# Patient Record
Sex: Male | Born: 1990 | Hispanic: Yes | Marital: Single | Smoking: Former smoker
Health system: Southern US, Community
[De-identification: ages and names within clinical notes are randomized; demographics above are authoritative.]

## PROBLEM LIST (undated history)

## (undated) DIAGNOSIS — B20 Human immunodeficiency virus [HIV] disease: Secondary | ICD-10-CM

## (undated) DIAGNOSIS — R109 Unspecified abdominal pain: Secondary | ICD-10-CM

## (undated) HISTORY — DX: Unspecified abdominal pain: R10.9

---

## 2009-07-16 ENCOUNTER — Emergency Department (HOSPITAL_COMMUNITY): Admission: EM | Admit: 2009-07-16 | Discharge: 2009-07-16 | Payer: Self-pay | Admitting: Emergency Medicine

## 2009-10-04 ENCOUNTER — Emergency Department (HOSPITAL_COMMUNITY): Admission: EM | Admit: 2009-10-04 | Discharge: 2009-10-04 | Payer: Self-pay | Admitting: Emergency Medicine

## 2011-06-19 ENCOUNTER — Emergency Department (HOSPITAL_COMMUNITY)
Admission: EM | Admit: 2011-06-19 | Discharge: 2011-06-20 | Disposition: A | Discharge status: Home / Self Care | Payer: Self-pay | Attending: Emergency Medicine | Admitting: Emergency Medicine

## 2011-06-19 DIAGNOSIS — N342 Other urethritis: Secondary | ICD-10-CM | POA: Insufficient documentation

## 2011-06-19 DIAGNOSIS — F17200 Nicotine dependence, unspecified, uncomplicated: Secondary | ICD-10-CM | POA: Insufficient documentation

## 2011-06-20 ENCOUNTER — Encounter (HOSPITAL_COMMUNITY): Payer: Self-pay | Admitting: Emergency Medicine

## 2011-06-20 MED ORDER — AZITHROMYCIN 1 G PO PACK
1.0000 g | PACK | Freq: Once | ORAL | Status: AC
  Administered 2011-06-20: 1 g via ORAL
  Filled 2011-06-20: qty 1

## 2011-06-20 MED ORDER — CEFTRIAXONE SODIUM 250 MG IJ SOLR
250.0000 mg | Freq: Once | INTRAMUSCULAR | Status: AC
  Administered 2011-06-20: 250 mg via INTRAMUSCULAR
  Filled 2011-06-20: qty 250

## 2011-06-20 NOTE — ED Notes (Signed)
C/o green penile discharge that started today.  Reports pain with urination x 3 days.  Reports unprotected sex while on vacation.

## 2011-06-20 NOTE — ED Notes (Signed)
Pt. Discharged to home.

## 2011-06-20 NOTE — ED Provider Notes (Signed)
History     CSN: [id number]  Arrival date & time 06/19/11  2355   First MD Initiated Contact with Patient 06/20/11 0046      Chief Complaint  Patient presents with  . SEXUALLY TRANSMITTED DISEASE    (Consider location/radiation/quality/duration/timing/severity/associated sxs/prior treatment) HPI Comments: Patient here with green penile discharge and burning with urination that started today - states that he went on vacation several weeks ago in [place] he got drunk and had unprotected intercourse with stranger.  He states that burning started about 3 days ago and then he got the discharge today - denies fever, chills, testicular pain, redness, penile pain.    Patient is a 21 y.o. male presenting with male genitourinary complaint. The history is provided by the patient. No language interpreter was used.  Male GU Problem Primary symptoms include dysuria and penile discharge.  Primary symptoms include no genital itching, no genital lesions, no genital rash, no penile pain, no priapism and no scrotal pain. This is a new problem. The current episode started 12 to 24 hours ago. The problem occurs constantly. The problem has not changed since onset.The symptoms occur during urination. The discharge is purulent. Pertinent negatives include no anorexia, no diaphoresis, no nausea, no vomiting, no abdominal pain, no abdominal swelling, no frequency, no constipation and no diarrhea. There has been no fever. He has tried nothing for the symptoms. The treatment provided no relief. Sexual activity: sexually active and multiple partners. He inconsistently uses condoms. It is unknown if his sexual partner displays symptoms of an STD.    History reviewed. No pertinent past medical history.  History reviewed. No pertinent past surgical history.  No family history on file.  History  Substance Use Topics  . Smoking status: Current Everyday Smoker  . Smokeless tobacco: Not on file  . Alcohol Use: No       Review of Systems  Constitutional: Negative for diaphoresis.  Gastrointestinal: Negative for nausea, vomiting, abdominal pain, diarrhea, constipation and anorexia.  Genitourinary: Positive for dysuria, discharge and penile discharge. Negative for frequency, scrotal swelling, penile pain and testicular pain.  All other systems reviewed and are negative.    Allergies  Review of patient's allergies indicates no known allergies.  Home Medications  No current outpatient prescriptions on file.  BP 120/76  Pulse 82  Temp 98.8 F (37.1 C) (Oral)  Resp 16  SpO2 97%  Physical Exam  Nursing note and vitals reviewed. Constitutional: He is oriented to person, place, and time. He appears well-developed and well-nourished. No distress.  HENT:  Head: Normocephalic and atraumatic.  Right Ear: External ear normal.  Left Ear: External ear normal.  Nose: Nose normal.  Mouth/Throat: Oropharynx is clear and moist. No oropharyngeal exudate.  Eyes: Conjunctivae are normal. Pupils are equal, round, and reactive to light. No scleral icterus.  Neck: Normal range of motion. Neck supple.  Cardiovascular: Normal rate, regular rhythm and normal heart sounds.  Exam reveals no gallop and no friction rub.   No murmur heard. Pulmonary/Chest: Effort normal and breath sounds normal. No respiratory distress. He has no wheezes. He has no rales. He exhibits no tenderness.  Abdominal: Soft. Bowel sounds are normal. He exhibits no distension. There is no tenderness.  Genitourinary: Testes normal. Cremasteric reflex is present. Right testis shows no mass, no swelling and no tenderness. Left testis shows no mass, no swelling and no tenderness. Uncircumcised. No penile tenderness. Discharge found.  Musculoskeletal: Normal range of motion. He exhibits no edema and no tenderness.  Lymphadenopathy:    He has no cervical adenopathy.  Neurological: He is alert and oriented to person, place, and time. No cranial  nerve deficit.  Skin: Skin is warm and dry. No rash noted. No erythema. No pallor.  Psychiatric: He has a normal mood and affect. His behavior is normal. Judgment and thought content normal.    ED Course  Procedures (including critical care time)   Labs Reviewed  GC/CHLAMYDIA PROBE AMP, GENITAL   No results found.   Urethritis    MDM  Patient here with symptoms consistent with gonorrhea, though I will treat for both gonorrhea and chlamydia.  Culture sent and patient encouraged to get HIV testing at the Health department as well.        Izola Price [place], [place] 06/20/11 [phone]

## 2011-06-20 NOTE — ED Notes (Signed)
Received pt. From triage, pt. Alert and oriented, gait steady, pt. To BR

## 2011-06-20 NOTE — ED Provider Notes (Signed)
Medical screening examination/treatment/procedure(s) were performed by non-physician practitioner and as supervising physician I was immediately available for consultation/collaboration.  Jasmine Awe, MD 06/20/11 [phone]

## 2011-06-20 NOTE — Discharge Instructions (Signed)
Urethritis, Adult Urethritis is an inflammation (soreness) of the urethra (the tube exiting from the bladder). It is often caused by germs that may be spread through sexual contact. TREATMENT  Urethritis will usually respond to antibiotics. These are medications that kill germs. Take all the medicine given to you. You may feel better in a couple days, but TAKE ALL MEDICINE or the infection may not be completely cured and may become more difficult to treat. Response can generally be expected in 7 to 10 days. You may require additional treatment after more testing. HOME CARE INSTRUCTIONS  Not have sex until the test results are known and treatment is completed.   Know that you may be asked to notify your sex partner when your final test results are back.   Finish all medications as prescribed.   Prevent sexually transmitted infections including AIDS. Practice safe sex. Use condoms.  SEEK MEDICAL CARE IF:   Your symptoms are not improved in 2 to 3 days.   Your symptoms are getting worse.   Your develop abdominal pain.   You develop joint pain.  SEEK IMMEDIATE MEDICAL CARE IF:   You have a fever.   You develop severe pain in the belly, back or side.   You develop repeated vomiting.  TEST RESULTS Not all test results are available during your visit. If your test results are not back during the visit, make an appointment with your caregiver to find out the results. Do not assume everything is normal if you have not heard from your caregiver or the medical facility. It is important for you to follow-up on all of your test results. Document Released: 06/19/2000 Document Revised: 12/13/2010 Document Reviewed: 01/09/2009 Exit[employer] Patient Information 2012 [employer].Urethritis, Adult Urethritis is an inflammation (soreness) of the urethra (the tube exiting from the bladder). It is often caused by germs that may be spread through sexual contact. TREATMENT  Urethritis will usually respond  to antibiotics. These are medications that kill germs. Take all the medicine given to you. You may feel better in a couple days, but TAKE ALL MEDICINE or the infection may not be completely cured and may become more difficult to treat. Response can generally be expected in 7 to 10 days. You may require additional treatment after more testing. HOME CARE INSTRUCTIONS  Not have sex until the test results are known and treatment is completed.   Know that you may be asked to notify your sex partner when your final test results are back.   Finish all medications as prescribed.   Prevent sexually transmitted infections including AIDS. Practice safe sex. Use condoms.  SEEK MEDICAL CARE IF:   Your symptoms are not improved in 2 to 3 days.   Your symptoms are getting worse.   Your develop abdominal pain.   You develop joint pain.  SEEK IMMEDIATE MEDICAL CARE IF:   You have a fever.   You develop severe pain in the belly, back or side.   You develop repeated vomiting.  TEST RESULTS Not all test results are available during your visit. If your test results are not back during the visit, make an appointment with your caregiver to find out the results. Do not assume everything is normal if you have not heard from your caregiver or the medical facility. It is important for you to follow-up on all of your test results. Document Released: 06/19/2000 Document Revised: 12/13/2010 Document Reviewed: 01/09/2009 Exit[employer] Patient Information 2012 [employer]. 

## 2011-06-22 LAB — GC/CHLAMYDIA PROBE AMP, GENITAL
Chlamydia, DNA Probe: NEGATIVE
GC Probe Amp, Genital: POSITIVE — AB

## 2011-06-23 NOTE — ED Notes (Signed)
+   Chlamydia. Treated with Rocephin and Zithromax. [facility] faxed. Will need to contact patient with result.

## 2011-06-27 NOTE — ED Notes (Signed)
Attempt made to contact patient via phone.-customer unavailable.

## 2011-06-28 NOTE — ED Notes (Signed)
Attempted to contact patient. Patient was not available. Sent letter after no answer x 3.

## 2011-07-02 NOTE — ED Notes (Addendum)
No response from letter.Chart closed out and sent to medical records.

## 2012-06-14 ENCOUNTER — Encounter (HOSPITAL_COMMUNITY): Payer: Self-pay | Admitting: Emergency Medicine

## 2012-06-14 ENCOUNTER — Emergency Department (HOSPITAL_COMMUNITY)
Admission: EM | Admit: 2012-06-14 | Discharge: 2012-06-14 | Disposition: A | Discharge status: Home / Self Care | Payer: BC Managed Care – PPO | Attending: Emergency Medicine | Admitting: Emergency Medicine

## 2012-06-14 DIAGNOSIS — F17200 Nicotine dependence, unspecified, uncomplicated: Secondary | ICD-10-CM | POA: Insufficient documentation

## 2012-06-14 DIAGNOSIS — R369 Urethral discharge, unspecified: Secondary | ICD-10-CM

## 2012-06-14 LAB — RPR: RPR Ser Ql: NONREACTIVE

## 2012-06-14 MED ORDER — CEFTRIAXONE SODIUM 250 MG IJ SOLR
250.0000 mg | Freq: Once | INTRAMUSCULAR | Status: AC
  Administered 2012-06-14: 250 mg via INTRAMUSCULAR
  Filled 2012-06-14: qty 250

## 2012-06-14 MED ORDER — AZITHROMYCIN 250 MG PO TABS
1000.0000 mg | ORAL_TABLET | Freq: Once | ORAL | Status: AC
  Administered 2012-06-14: 1000 mg via ORAL
  Filled 2012-06-14: qty 4

## 2012-06-14 MED ORDER — METRONIDAZOLE 500 MG PO TABS
2000.0000 mg | ORAL_TABLET | Freq: Once | ORAL | Status: AC
  Administered 2012-06-14: 2000 mg via ORAL
  Filled 2012-06-14: qty 4

## 2012-06-14 MED ORDER — LIDOCAINE HCL (PF) 1 % IJ SOLN
INTRAMUSCULAR | Status: AC
  Administered 2012-06-14: 0.9 mL
  Filled 2012-06-14: qty 5

## 2012-06-14 NOTE — ED Provider Notes (Signed)
Medical screening examination/treatment/procedure(s) were performed by non-physician practitioner and as supervising physician I was immediately available for consultation/collaboration.   Odie Edmonds, MD 06/14/12 1517 

## 2012-06-14 NOTE — ED Notes (Signed)
Patient presents to ED today with c/o of pain with urination and discharge from penis. Pt wants to be checked for STD and HIV.

## 2012-06-14 NOTE — ED Provider Notes (Signed)
History     CSN: [id number]  Arrival date & time 06/14/12  1237   First MD Initiated Contact with Patient 06/14/12 1353      Chief Complaint  Patient presents with  . Exposure to STD    (Consider location/radiation/quality/duration/timing/severity/associated sxs/prior treatment) HPI Patient says emergency department with penile discharge.  Patient, states, that he had unprotected sex about 3 weeks ago.  Patient, states, that he does not have any nausea, vomiting, abdominal pain, testicle pain, fever, diarrhea, weakness, chest pain, or shortness of breath.  Patient, states, that when he urinates he feels, significant discomfort.  She denies taking any medications prior to arrival History reviewed. No pertinent past medical history.  History reviewed. No pertinent past surgical history.  History reviewed. No pertinent family history.  History  Substance Use Topics  . Smoking status: Current Every Day Smoker  . Smokeless tobacco: Not on file  . Alcohol Use: No      Review of Systems All other systems negative except as documented in the HPI. All pertinent positives and negatives as reviewed in the HPI. Allergies  Review of patient's allergies indicates no known allergies.  Home Medications  No current outpatient prescriptions on file.  BP 112/72  Pulse 70  Temp(Src) 98 F (36.7 C) (Oral)  Resp 17  SpO2 99%  Physical Exam  Nursing note and vitals reviewed. Constitutional: He appears well-developed and well-nourished. No distress.  HENT:  Head: Normocephalic and atraumatic.  Neck: Normal range of motion. Neck supple.  Cardiovascular: Normal rate, regular rhythm and normal heart sounds.   Pulmonary/Chest: Effort normal.  Genitourinary: Testes normal. No penile erythema or penile tenderness. No discharge found.  Skin: Skin is warm and dry. No rash noted.    ED Course  Procedures (including critical care time) Patient be given IM Rocephin, Zithromax and Flagyl.   Told to return here as needed.  Follow up with health department for HIV testing.    MDM          Carlyle Dolly, PA-C 06/14/12 1406

## 2012-06-16 LAB — GC/CHLAMYDIA PROBE AMP
CT Probe RNA: POSITIVE — AB
GC Probe RNA: NEGATIVE

## 2012-06-17 NOTE — ED Notes (Signed)
+   Chlamydia Patient treated with Rocephin And Zithromax-[facility] faxed 

## 2012-09-27 ENCOUNTER — Encounter (HOSPITAL_COMMUNITY): Payer: Self-pay | Admitting: *Deleted

## 2012-09-27 ENCOUNTER — Emergency Department (HOSPITAL_COMMUNITY)
Admission: EM | Admit: 2012-09-27 | Discharge: 2012-09-28 | Disposition: A | Discharge status: Home / Self Care | Payer: BC Managed Care – PPO | Attending: Emergency Medicine | Admitting: Emergency Medicine

## 2012-09-27 DIAGNOSIS — IMO0001 Reserved for inherently not codable concepts without codable children: Secondary | ICD-10-CM | POA: Insufficient documentation

## 2012-09-27 DIAGNOSIS — B349 Viral infection, unspecified: Secondary | ICD-10-CM

## 2012-09-27 DIAGNOSIS — R110 Nausea: Secondary | ICD-10-CM | POA: Insufficient documentation

## 2012-09-27 DIAGNOSIS — B9789 Other viral agents as the cause of diseases classified elsewhere: Secondary | ICD-10-CM | POA: Insufficient documentation

## 2012-09-27 DIAGNOSIS — F17200 Nicotine dependence, unspecified, uncomplicated: Secondary | ICD-10-CM | POA: Insufficient documentation

## 2012-09-27 DIAGNOSIS — R0982 Postnasal drip: Secondary | ICD-10-CM | POA: Insufficient documentation

## 2012-09-27 DIAGNOSIS — R109 Unspecified abdominal pain: Secondary | ICD-10-CM | POA: Insufficient documentation

## 2012-09-27 DIAGNOSIS — R059 Cough, unspecified: Secondary | ICD-10-CM | POA: Insufficient documentation

## 2012-09-27 DIAGNOSIS — R05 Cough: Secondary | ICD-10-CM | POA: Insufficient documentation

## 2012-09-27 DIAGNOSIS — R51 Headache: Secondary | ICD-10-CM | POA: Insufficient documentation

## 2012-09-27 DIAGNOSIS — J3489 Other specified disorders of nose and nasal sinuses: Secondary | ICD-10-CM | POA: Insufficient documentation

## 2012-09-27 LAB — URINALYSIS, ROUTINE W REFLEX MICROSCOPIC
Bilirubin Urine: NEGATIVE
Leukocytes, UA: NEGATIVE
Nitrite: NEGATIVE
Specific Gravity, Urine: 1.027 (ref 1.005–1.030)
pH: 7 (ref 5.0–8.0)

## 2012-09-27 LAB — COMPREHENSIVE METABOLIC PANEL
Albumin: 4.1 g/dL (ref 3.5–5.2)
Alkaline Phosphatase: 50 U/L (ref 39–117)
BUN: 12 mg/dL (ref 6–23)
Potassium: 4 mEq/L (ref 3.5–5.1)
Sodium: 135 mEq/L (ref 135–145)
Total Protein: 6.8 g/dL (ref 6.0–8.3)

## 2012-09-27 LAB — CBC WITH DIFFERENTIAL/PLATELET
Basophils Relative: 0 % (ref 0–1)
Eosinophils Absolute: 0 10*3/uL (ref 0.0–0.7)
MCH: 28.5 pg (ref 26.0–34.0)
MCHC: 34.8 g/dL (ref 30.0–36.0)
Monocytes Relative: 11 % (ref 3–12)
Neutrophils Relative %: 62 % (ref 43–77)
Platelets: 123 10*3/uL — ABNORMAL LOW (ref 150–400)
RDW: 13.5 % (ref 11.5–15.5)

## 2012-09-27 LAB — LIPASE, BLOOD: Lipase: 30 U/L (ref 11–59)

## 2012-09-27 MED ORDER — PSEUDOEPHEDRINE HCL ER 120 MG PO TB12
120.0000 mg | ORAL_TABLET | Freq: Once | ORAL | Status: AC
  Administered 2012-09-28: 120 mg via ORAL
  Filled 2012-09-27: qty 1

## 2012-09-27 MED ORDER — ACETAMINOPHEN 325 MG PO TABS
650.0000 mg | ORAL_TABLET | Freq: Once | ORAL | Status: AC
  Administered 2012-09-27: 650 mg via ORAL

## 2012-09-27 MED ORDER — PSEUDOEPHEDRINE HCL ER 120 MG PO TB12: 120.0000 mg | ORAL_TABLET | Freq: Two times a day (BID) | ORAL | Status: DC

## 2012-09-27 NOTE — ED Notes (Signed)
Cold sorethroat fever since yesterday

## 2012-09-27 NOTE — ED Provider Notes (Signed)
CSN: [id number]     Arrival date & time 09/27/12  1933 History   First MD Initiated Contact with Patient 09/27/12 2325     Chief Complaint  Patient presents with  . Sore Throat   (Consider location/radiation/quality/duration/timing/severity/associated sxs/prior Treatment) HPI Comments: Patient states, that for the past, week.  He's had rhinitis, nasal congestion, postnasal drip, occasional cough, headache, nausea, but no vomiting, and generalized myalgias.  She reports fever to 99.6. States he is taking ibuprofen occasionally, without any relief  Patient is a 22 y.o. male presenting with pharyngitis. The history is provided by the patient.  Sore Throat This is a new problem. The current episode started in the past 7 days. The problem occurs intermittently. The problem has been unchanged. Associated symptoms include abdominal pain, congestion, coughing, headaches, myalgias, nausea and a sore throat. Pertinent negatives include no fever, neck pain, vomiting or weakness. The symptoms are aggravated by drinking and swallowing. He has tried NSAIDs for the symptoms. The treatment provided mild relief.    History reviewed. No pertinent past medical history. History reviewed. No pertinent past surgical history. No family history on file. History  Substance Use Topics  . Smoking status: Current Every Day Smoker  . Smokeless tobacco: Not on file  . Alcohol Use: No    Review of Systems  Constitutional: Negative for fever.  HENT: Positive for congestion, sore throat, rhinorrhea and postnasal drip. Negative for ear pain and neck pain.   Respiratory: Positive for cough. Negative for shortness of breath.   Gastrointestinal: Positive for nausea and abdominal pain. Negative for vomiting, diarrhea and constipation.  Genitourinary: Negative for dysuria.  Musculoskeletal: Positive for myalgias.  Neurological: Positive for headaches. Negative for dizziness and weakness.  All other systems reviewed and  are negative.    Allergies  Review of patient's allergies indicates no known allergies.  Home Medications   Current Outpatient Rx  Name  Route  Sig  Dispense  Refill  . ibuprofen (ADVIL,MOTRIN) 200 MG tablet   Oral   Take 400 mg by mouth every 6 (six) hours as needed for pain.         . naproxen sodium (ANAPROX) 220 MG tablet   Oral   Take 440 mg by mouth as needed (pain).         . pseudoephedrine (SUDAFED 12 HOUR) 120 MG 12 hr tablet   Oral   Take 1 tablet (120 mg total) by mouth every 12 (twelve) hours.   30 tablet   0    BP 113/77  Pulse 105  Temp(Src) 100.9 F (38.3 C) (Oral)  Resp 20  SpO2 99% Physical Exam  Nursing note and vitals reviewed. Constitutional: He is oriented to person, place, and time. He appears well-developed and well-nourished.  HENT:  Head: Normocephalic.  Right Ear: External ear normal.  Left Ear: External ear normal.  Posterior pharynx slightly erythematous.  No tonsillar exudate  Eyes: Pupils are equal, round, and reactive to light.  Neck: Normal range of motion.  Cardiovascular: Normal rate and regular rhythm.   Pulmonary/Chest: Effort normal and breath sounds normal. No respiratory distress. He has no wheezes. He exhibits no tenderness.  Abdominal: Soft. He exhibits no distension.  Neurological: He is alert and oriented to person, place, and time.  Skin: No rash noted. No erythema.    ED Course  Procedures (including critical care time) Labs Review Labs Reviewed  CBC WITH DIFFERENTIAL - Abnormal; Notable for the following:    WBC 3.4 (*)  Platelets 123 (*)    All other components within normal limits  COMPREHENSIVE METABOLIC PANEL - Abnormal; Notable for the following:    Glucose, Bld 106 (*)    Total Bilirubin 0.2 (*)    All other components within normal limits  RAPID STREP SCREEN  CULTURE, GROUP A STREP  LIPASE, BLOOD  URINALYSIS, ROUTINE W REFLEX MICROSCOPIC   Imaging Review No results found.  MDM   1. Viral  syndrome     Patient is in no acute distress    Arman Filter, NP 09/27/12 2345

## 2012-09-28 NOTE — ED Provider Notes (Signed)
Medical screening examination/treatment/procedure(s) were performed by non-physician practitioner and as supervising physician I was immediately available for consultation/collaboration.  Amarii Bordas M Keyatta Tolles, MD 09/28/12 0524 

## 2012-09-29 LAB — CULTURE, GROUP A STREP

## 2012-10-06 ENCOUNTER — Encounter (HOSPITAL_COMMUNITY): Payer: Self-pay | Admitting: Emergency Medicine

## 2012-10-06 ENCOUNTER — Emergency Department (HOSPITAL_COMMUNITY)
Admission: EM | Admit: 2012-10-06 | Discharge: 2012-10-07 | Disposition: A | Discharge status: Home / Self Care | Payer: BC Managed Care – PPO | Attending: Emergency Medicine | Admitting: Emergency Medicine

## 2012-10-06 DIAGNOSIS — R1110 Vomiting, unspecified: Secondary | ICD-10-CM | POA: Insufficient documentation

## 2012-10-06 DIAGNOSIS — K5289 Other specified noninfective gastroenteritis and colitis: Secondary | ICD-10-CM | POA: Insufficient documentation

## 2012-10-06 DIAGNOSIS — K529 Noninfective gastroenteritis and colitis, unspecified: Secondary | ICD-10-CM

## 2012-10-06 DIAGNOSIS — R109 Unspecified abdominal pain: Secondary | ICD-10-CM | POA: Insufficient documentation

## 2012-10-06 DIAGNOSIS — F17200 Nicotine dependence, unspecified, uncomplicated: Secondary | ICD-10-CM | POA: Insufficient documentation

## 2012-10-06 DIAGNOSIS — R51 Headache: Secondary | ICD-10-CM | POA: Insufficient documentation

## 2012-10-06 LAB — CBC WITH DIFFERENTIAL/PLATELET
Basophils Relative: 1 % (ref 0–1)
Eosinophils Absolute: 0 10*3/uL (ref 0.0–0.7)
MCH: 28.2 pg (ref 26.0–34.0)
MCHC: 35.2 g/dL (ref 30.0–36.0)
Neutrophils Relative %: 57 % (ref 43–77)
Platelets: 228 10*3/uL (ref 150–400)
RBC: 5.31 MIL/uL (ref 4.22–5.81)
RDW: 13.2 % (ref 11.5–15.5)

## 2012-10-06 LAB — COMPREHENSIVE METABOLIC PANEL
ALT: 45 U/L (ref 0–53)
Albumin: 4 g/dL (ref 3.5–5.2)
Alkaline Phosphatase: 51 U/L (ref 39–117)
Calcium: 9.2 mg/dL (ref 8.4–10.5)
Potassium: 3.4 mEq/L — ABNORMAL LOW (ref 3.5–5.1)
Sodium: 139 mEq/L (ref 135–145)
Total Protein: 7.1 g/dL (ref 6.0–8.3)

## 2012-10-06 LAB — LIPASE, BLOOD: Lipase: 19 U/L (ref 11–59)

## 2012-10-06 MED ORDER — IOHEXOL 300 MG/ML  SOLN
25.0000 mL | INTRAMUSCULAR | Status: AC
  Administered 2012-10-06 (×2): 25 mL via ORAL

## 2012-10-06 MED ORDER — SODIUM CHLORIDE 0.9 % IV BOLUS (SEPSIS)
1000.0000 mL | Freq: Once | INTRAVENOUS | Status: AC
  Administered 2012-10-06: 1000 mL via INTRAVENOUS

## 2012-10-06 MED ORDER — SODIUM CHLORIDE 0.9 % IV SOLN
INTRAVENOUS | Status: DC
  Administered 2012-10-06: 22:00:00 via INTRAVENOUS

## 2012-10-06 MED ORDER — METOCLOPRAMIDE HCL 5 MG/ML IJ SOLN
5.0000 mg | Freq: Once | INTRAMUSCULAR | Status: AC
  Administered 2012-10-06: 5 mg via INTRAVENOUS
  Filled 2012-10-06: qty 2

## 2012-10-06 MED ORDER — MORPHINE SULFATE 4 MG/ML IJ SOLN
4.0000 mg | Freq: Once | INTRAMUSCULAR | Status: AC
  Administered 2012-10-06: 4 mg via INTRAVENOUS
  Filled 2012-10-06: qty 1

## 2012-10-06 MED ORDER — ONDANSETRON HCL 4 MG/2ML IJ SOLN
4.0000 mg | Freq: Once | INTRAMUSCULAR | Status: AC
  Administered 2012-10-06: 4 mg via INTRAVENOUS

## 2012-10-06 MED ORDER — ONDANSETRON HCL 4 MG/2ML IJ SOLN
INTRAMUSCULAR | Status: AC
  Administered 2012-10-06: 4 mg via INTRAVENOUS
  Filled 2012-10-06: qty 2

## 2012-10-06 NOTE — ED Notes (Signed)
Pt. reports persistent diarrhea with emesis , generalized body aches and generalized abdominal cramping for several days .

## 2012-10-06 NOTE — ED Provider Notes (Signed)
CSN: [id number]     Arrival date & time 10/06/12  1904 History   First MD Initiated Contact with Patient 10/06/12 2122     Chief Complaint  Patient presents with  . Diarrhea   (Consider location/radiation/quality/duration/timing/severity/associated sxs/prior Treatment) Patient is a 22 y.o. male presenting with diarrhea. The history is provided by the patient.  Diarrhea  patient complaining of persistent diarrhea and emesis x5 days. His emesis has been bilious and diarrhea has been watery. Notes generalized body aches and temperature at home up to 100.8. Denies any cough. No cervical or ear pain. Seen for similar symptoms a few days ago and had negative workup at that time and diagnosed with viral syndrome. No urinary symptoms reported. Symptoms have been gradually worse and nothing makes them better. No new treatments used prior to arrival  History reviewed. No pertinent past medical history. History reviewed. No pertinent past surgical history. No family history on file. History  Substance Use Topics  . Smoking status: Current Every Day Smoker  . Smokeless tobacco: Not on file  . Alcohol Use: No    Review of Systems  Gastrointestinal: Positive for diarrhea.  All other systems reviewed and are negative.    Allergies  Review of patient's allergies indicates no known allergies.  Home Medications   Current Outpatient Rx  Name  Route  Sig  Dispense  Refill  . ibuprofen (ADVIL,MOTRIN) 200 MG tablet   Oral   Take 400 mg by mouth every 6 (six) hours as needed for pain.         . naproxen sodium (ANAPROX) 220 MG tablet   Oral   Take 440 mg by mouth as needed (pain).         . pseudoephedrine (SUDAFED 12 HOUR) 120 MG 12 hr tablet   Oral   Take 1 tablet (120 mg total) by mouth every 12 (twelve) hours.   30 tablet   0    BP 114/67  Pulse 80  Temp(Src) 98.5 F (36.9 C) (Oral)  Resp 18  SpO2 98% Physical Exam  Nursing note and vitals reviewed. Constitutional: He is  oriented to person, place, and time. He appears well-developed and well-nourished.  Non-toxic appearance. No distress.  HENT:  Head: Normocephalic and atraumatic.  Eyes: Conjunctivae, EOM and lids are normal. Pupils are equal, round, and reactive to light.  Neck: Normal range of motion. Neck supple. No tracheal deviation present. No mass present.  Cardiovascular: Normal rate, regular rhythm and normal heart sounds.  Exam reveals no gallop.   No murmur heard. Pulmonary/Chest: Effort normal and breath sounds normal. No stridor. No respiratory distress. He has no decreased breath sounds. He has no wheezes. He has no rhonchi. He has no rales.  Abdominal: Soft. Normal appearance and bowel sounds are normal. He exhibits no distension. There is tenderness in the right upper quadrant and epigastric area. There is guarding. There is no rigidity, no rebound and no CVA tenderness.    Musculoskeletal: Normal range of motion. He exhibits no edema and no tenderness.  Neurological: He is alert and oriented to person, place, and time. He has normal strength. No cranial nerve deficit or sensory deficit. GCS eye subscore is 4. GCS verbal subscore is 5. GCS motor subscore is 6.  Skin: Skin is warm and dry. No abrasion and no rash noted.  Psychiatric: He has a normal mood and affect. His speech is normal and behavior is normal.    ED Course  Procedures (including critical care time)  Labs Review Labs Reviewed  CBC WITH DIFFERENTIAL - Abnormal; Notable for the following:    Monocytes Relative 13 (*)    All other components within normal limits  COMPREHENSIVE METABOLIC PANEL - Abnormal; Notable for the following:    Potassium 3.4 (*)    AST 50 (*)    All other components within normal limits  URINALYSIS, ROUTINE W REFLEX MICROSCOPIC  LIPASE, BLOOD   Imaging Review No results found.  MDM  No diagnosis found. Pt given pain meds and iv fluids--abd ct pending, dr. Effie Shy to f/u    Toy Baker,  MD 10/06/12 2312

## 2012-10-06 NOTE — ED Notes (Signed)
During nursing assessment, pt started to vomit yellow emesis.  Per protocol, 4 mg of Zofran IV overridden from pyxis.

## 2012-10-07 ENCOUNTER — Encounter (HOSPITAL_COMMUNITY): Payer: Self-pay | Admitting: Radiology

## 2012-10-07 ENCOUNTER — Emergency Department (HOSPITAL_COMMUNITY): Payer: BC Managed Care – PPO

## 2012-10-07 LAB — URINALYSIS, ROUTINE W REFLEX MICROSCOPIC
Bilirubin Urine: NEGATIVE
Glucose, UA: NEGATIVE mg/dL
Ketones, ur: NEGATIVE mg/dL
Leukocytes, UA: NEGATIVE
Specific Gravity, Urine: 1.01 (ref 1.005–1.030)
pH: 6.5 (ref 5.0–8.0)

## 2012-10-07 MED ORDER — IOHEXOL 300 MG/ML  SOLN
100.0000 mL | Freq: Once | INTRAMUSCULAR | Status: AC | PRN
  Administered 2012-10-07: 100 mL via INTRAVENOUS

## 2012-10-07 MED ORDER — OXYCODONE-ACETAMINOPHEN 5-325 MG PO TABS
1.0000 | ORAL_TABLET | Freq: Once | ORAL | Status: AC
  Administered 2012-10-07: 1 via ORAL
  Filled 2012-10-07: qty 1

## 2012-10-07 MED ORDER — OXYCODONE-ACETAMINOPHEN 5-325 MG PO TABS: 1.0000 | ORAL_TABLET | ORAL | Status: DC | PRN

## 2012-10-07 MED ORDER — ONDANSETRON HCL 8 MG PO TABS: 8.0000 mg | ORAL_TABLET | Freq: Three times a day (TID) | ORAL | Status: DC | PRN

## 2012-10-07 MED ORDER — ONDANSETRON 4 MG PO TBDP
8.0000 mg | ORAL_TABLET | Freq: Once | ORAL | Status: AC
  Administered 2012-10-07: 8 mg via ORAL
  Filled 2012-10-07: qty 2

## 2012-10-07 MED ORDER — METOCLOPRAMIDE HCL 5 MG/ML IJ SOLN
5.0000 mg | Freq: Once | INTRAMUSCULAR | Status: AC
  Administered 2012-10-07: 5 mg via INTRAVENOUS

## 2012-10-07 NOTE — ED Provider Notes (Signed)
Reevaluation (1610): He was initially seen by Dr. Freida Busman for vomiting, and diarrhea. CT scan was ordered. Cannulated for persistent vomiting, requiring multiple treatments. CT scan has been done, and does not show acute abnormality. She states his abdominal pain has resolved. He complains of a persistent headache  PE; repeat vital signs are normal. Abdomen nontender to palpation. Heart regular rate and rhythm. No murmur. Neck is supple. Neurologic nonfocal  Ct Abdomen Pelvis W Contrast  10/07/2012   CLINICAL DATA:  Persistent diarrhea. Body aches.  EXAM: CT ABDOMEN AND PELVIS WITH CONTRAST  TECHNIQUE: Multidetector CT imaging of the abdomen and pelvis was performed using the standard protocol following bolus administration of intravenous contrast.  CONTRAST:  OMNIPAQUE IOHEXOL 300 MG/ML  SOLN  COMPARISON:  None.  FINDINGS: Lung bases are clear. No effusions. Heart is normal size.  Liver, gallbladder, spleen, pancreas, adrenals and kidneys are normal. Appendix is visualized and is normal. Bowel grossly unremarkable. No free fluid, free air, or adenopathy. Urinary bladder is unremarkable.  No acute bony abnormality.  IMPRESSION: Negative study.   Electronically Signed   By: Charlett Nose M.D.   On: 10/07/2012 02:36   Results for orders placed during the hospital encounter of 10/06/12  CBC WITH DIFFERENTIAL      Result Value Range   WBC 4.7  4.0 - 10.5 K/uL   RBC 5.31  4.22 - 5.81 MIL/uL   Hemoglobin 15.0  13.0 - 17.0 g/dL   HCT 96.0  45.4 - 09.8 %   MCV 80.2  78.0 - 100.0 fL   MCH 28.2  26.0 - 34.0 pg   MCHC 35.2  30.0 - 36.0 g/dL   RDW 11.9  14.7 - 82.9 %   Platelets 228  150 - 400 K/uL   Neutrophils Relative % 57  43 - 77 %   Neutro Abs 2.7  1.7 - 7.7 K/uL   Lymphocytes Relative 29  12 - 46 %   Lymphs Abs 1.4  0.7 - 4.0 K/uL   Monocytes Relative 13 (*) 3 - 12 %   Monocytes Absolute 0.6  0.1 - 1.0 K/uL   Eosinophils Relative 0  0 - 5 %   Eosinophils Absolute 0.0  0.0 - 0.7 K/uL   Basophils Relative 1  0 - 1 %   Basophils Absolute 0.1  0.0 - 0.1 K/uL  COMPREHENSIVE METABOLIC PANEL      Result Value Range   Sodium 139  135 - 145 mEq/L   Potassium 3.4 (*) 3.5 - 5.1 mEq/L   Chloride 102  96 - 112 mEq/L   CO2 26  19 - 32 mEq/L   Glucose, Bld 93  70 - 99 mg/dL   BUN 11  6 - 23 mg/dL   Creatinine, Ser 5.62  0.50 - 1.35 mg/dL   Calcium 9.2  8.4 - 13.0 mg/dL   Total Protein 7.1  6.0 - 8.3 g/dL   Albumin 4.0  3.5 - 5.2 g/dL   AST 50 (*) 0 - 37 U/L   ALT 45  0 - 53 U/L   Alkaline Phosphatase 51  39 - 117 U/L   Total Bilirubin 0.4  0.3 - 1.2 mg/dL   GFR calc non Af Amer >90  >90 mL/min   GFR calc Af Amer >90  >90 mL/min  LIPASE, BLOOD      Result Value Range   Lipase 19  11 - 59 U/L      We'll give additional medication for pain  and nausea and arrange discharge  Assessment: Gastroenteritis with nonspecific headache. He is stable for discharge. Doubt colitis, meningitis, sinusitis, or metabolic instability.  Nursing Notes Reviewed/ Care Coordinated, and agree without changes. Applicable Imaging Reviewed.  Interpretation of Laboratory Data incorporated into ED treatment   Plan: Home Medications- Percocet, Zofran; Home Treatments and Observation- rest, gradually advance diet; return here if the recommended treatment, does not improve the symptoms; Recommended follow up- PCP prn      Flint Melter, MD 10/07/12 [phone]

## 2012-10-07 NOTE — ED Notes (Signed)
This RN called into room by pt.  Pt noted to have thrown up CT contrast in emesis bag.  Emesis yellow in nature.  EDP made aware.

## 2012-10-07 NOTE — ED Notes (Signed)
CT notified of delay in pt drinking contrast.  Pt tolerating second cup of contrast.

## 2013-01-22 ENCOUNTER — Telehealth (HOSPITAL_COMMUNITY): Payer: Self-pay

## 2013-01-22 NOTE — ED Notes (Signed)
[facility] to [facility] this is same individual who has tested (+) [place] in [place].  Provided last 4 of pt's SS# for verification

## 2013-05-26 ENCOUNTER — Encounter (HOSPITAL_COMMUNITY): Payer: Self-pay | Admitting: Emergency Medicine

## 2013-05-26 ENCOUNTER — Emergency Department (HOSPITAL_COMMUNITY)
Admission: EM | Admit: 2013-05-26 | Discharge: 2013-05-26 | Discharge status: Against Medical Advice | Payer: BC Managed Care – PPO | Attending: Emergency Medicine | Admitting: Emergency Medicine

## 2013-05-26 DIAGNOSIS — F17200 Nicotine dependence, unspecified, uncomplicated: Secondary | ICD-10-CM | POA: Insufficient documentation

## 2013-05-26 DIAGNOSIS — R51 Headache: Secondary | ICD-10-CM | POA: Insufficient documentation

## 2013-05-26 LAB — CBC WITH DIFFERENTIAL/PLATELET
BASOS ABS: 0.1 10*3/uL (ref 0.0–0.1)
Basophils Relative: 1 % (ref 0–1)
Eosinophils Absolute: 0.4 10*3/uL (ref 0.0–0.7)
Eosinophils Relative: 5 % (ref 0–5)
HCT: 47.2 % (ref 39.0–52.0)
HEMOGLOBIN: 15.6 g/dL (ref 13.0–17.0)
LYMPHS PCT: 41 % (ref 12–46)
Lymphs Abs: 2.8 10*3/uL (ref 0.7–4.0)
MCH: 28.4 pg (ref 26.0–34.0)
MCHC: 33.1 g/dL (ref 30.0–36.0)
MCV: 85.8 fL (ref 78.0–100.0)
MONO ABS: 0.7 10*3/uL (ref 0.1–1.0)
MONOS PCT: 10 % (ref 3–12)
NEUTROS PCT: 43 % (ref 43–77)
Neutro Abs: 2.9 10*3/uL (ref 1.7–7.7)
Platelets: 198 10*3/uL (ref 150–400)
RBC: 5.5 MIL/uL (ref 4.22–5.81)
RDW: 14.8 % (ref 11.5–15.5)
WBC: 6.8 10*3/uL (ref 4.0–10.5)

## 2013-05-26 LAB — URINALYSIS, ROUTINE W REFLEX MICROSCOPIC
Bilirubin Urine: NEGATIVE
Glucose, UA: NEGATIVE mg/dL
Hgb urine dipstick: NEGATIVE
Ketones, ur: NEGATIVE mg/dL
LEUKOCYTES UA: NEGATIVE
Nitrite: NEGATIVE
PH: 6 (ref 5.0–8.0)
Protein, ur: NEGATIVE mg/dL
Specific Gravity, Urine: 1.026 (ref 1.005–1.030)
Urobilinogen, UA: 1 mg/dL (ref 0.0–1.0)

## 2013-05-26 LAB — COMPREHENSIVE METABOLIC PANEL
ALT: 13 U/L (ref 0–53)
AST: 23 U/L (ref 0–37)
Albumin: 4.6 g/dL (ref 3.5–5.2)
Alkaline Phosphatase: 47 U/L (ref 39–117)
BUN: 13 mg/dL (ref 6–23)
CHLORIDE: 103 meq/L (ref 96–112)
CO2: 27 meq/L (ref 19–32)
CREATININE: 0.97 mg/dL (ref 0.50–1.35)
Calcium: 9.6 mg/dL (ref 8.4–10.5)
GFR calc Af Amer: >90 mL/min (ref >90)
Glucose, Bld: 97 mg/dL (ref 70–99)
Potassium: 4.1 mEq/L (ref 3.7–5.3)
Sodium: 142 mEq/L (ref 137–147)
Total Bilirubin: 0.3 mg/dL (ref 0.3–1.2)
Total Protein: 7.7 g/dL (ref 6.0–8.3)

## 2013-05-26 NOTE — ED Notes (Addendum)
Pt is concerned he has an STD, he reports pain with urination, slight abdominal pain, itching to buttock and penis and pain with urination. Just recently came here from [place] 2 weeks ago. Pt also concerned because he ahs had diarrhea for 2 and is going 2-3 times a d weeks as well.  Associated with feeling tired and weak and nausea.

## 2014-01-04 ENCOUNTER — Emergency Department (HOSPITAL_COMMUNITY)
Admission: EM | Admit: 2014-01-04 | Discharge: 2014-01-05 | Disposition: A | Discharge status: Home / Self Care | Payer: BC Managed Care – PPO | Attending: Emergency Medicine | Admitting: Emergency Medicine

## 2014-01-04 ENCOUNTER — Encounter (HOSPITAL_COMMUNITY): Payer: Self-pay | Admitting: Emergency Medicine

## 2014-01-04 DIAGNOSIS — R369 Urethral discharge, unspecified: Secondary | ICD-10-CM

## 2014-01-04 DIAGNOSIS — Z720 Tobacco use: Secondary | ICD-10-CM | POA: Insufficient documentation

## 2014-01-04 DIAGNOSIS — Z21 Asymptomatic human immunodeficiency virus [HIV] infection status: Secondary | ICD-10-CM | POA: Insufficient documentation

## 2014-01-04 DIAGNOSIS — K1370 Unspecified lesions of oral mucosa: Secondary | ICD-10-CM | POA: Insufficient documentation

## 2014-01-04 HISTORY — DX: Human immunodeficiency virus (HIV) disease: B20

## 2014-01-04 LAB — URINE MICROSCOPIC-ADD ON

## 2014-01-04 LAB — URINALYSIS, ROUTINE W REFLEX MICROSCOPIC
Bilirubin Urine: NEGATIVE
GLUCOSE, UA: NEGATIVE mg/dL
KETONES UR: NEGATIVE mg/dL
Nitrite: NEGATIVE
Protein, ur: NEGATIVE mg/dL
Specific Gravity, Urine: 1.025 (ref 1.005–1.030)
Urobilinogen, UA: 0.2 mg/dL (ref 0.0–1.0)
pH: 5.5 (ref 5.0–8.0)

## 2014-01-04 MED ORDER — CEFTRIAXONE SODIUM 250 MG IJ SOLR
250.0000 mg | Freq: Once | INTRAMUSCULAR | Status: AC
  Administered 2014-01-04: 250 mg via INTRAMUSCULAR
  Filled 2014-01-04: qty 250

## 2014-01-04 MED ORDER — AZITHROMYCIN 250 MG PO TABS
1000.0000 mg | ORAL_TABLET | Freq: Once | ORAL | Status: AC
  Administered 2014-01-04: 1000 mg via ORAL
  Filled 2014-01-04: qty 4

## 2014-01-04 MED ORDER — STERILE WATER FOR INJECTION IJ SOLN
INTRAMUSCULAR | Status: AC
  Administered 2014-01-04: 10 mL
  Filled 2014-01-04: qty 10

## 2014-01-04 NOTE — ED Provider Notes (Signed)
CSN: [id number]     Arrival date & time 01/04/14  2221 History  This chart was scribed for non-physician practitioner, Trixie DredgeEmily Emili Mcloughlin, PA-C, working with Doug SouSam Jacubowitz, MD, by Bronson CurbJacqueline Melvin, ED Scribe. This patient was seen in room TR06C/TR06C and the patient's care was started at 11:33 PM.    Chief Complaint  Patient presents with  . Penile Discharge    The history is provided by the patient. No language interpreter was used.     HPI Comments: Carl Barber is a 23 y.o. male with HIV disease, currently untreated, who presents to the Emergency Department complaining of green penile discharge for the past 3 days. Patient also notes a little blood in his boxers. Patient reports having unprotected sex and oral sex with multiple partners. There is a lesion to the left corner of the mouth that he describes as painful and burning, in addition to penile pain when urinating. Patient has used no treatments. He denies fever, abdominal pain, nausea, vomiting, or diarrhea.  Pt states [place] previously [place] free medications but cannot afford the HIV medications on his own and has been told he doesn't qualify for assistance.  States his sexual partners do know that he is HIV positive.   Past Medical History  Diagnosis Date  . HIV disease    History reviewed. No pertinent past surgical history. No family history on file. History  Substance Use Topics  . Smoking status: Current Every Day Smoker  . Smokeless tobacco: Not on file  . Alcohol Use: No    Review of Systems  Constitutional: Negative for fever and chills.  Gastrointestinal: Negative for nausea, vomiting, abdominal pain and diarrhea.  Genitourinary: Positive for discharge. Negative for scrotal swelling and testicular pain.  Musculoskeletal: Negative for back pain.  Skin: Negative for color change, rash and wound.  Allergic/Immunologic: Positive for immunocompromised state (HIV positive, unknown CD4 count).       Allergies  Review of patient's allergies indicates no known allergies.  Home Medications   Prior to Admission medications   Not on File   Triage Vitals: BP 120/71 mmHg  Pulse 79  Temp(Src) 98.1 F (36.7 C) (Oral)  Resp 18  Ht 5\' 4"  (1.626 m)  Wt 116 lb (52.617 kg)  BMI 19.90 kg/m2  SpO2 99%  Physical Exam  Constitutional: He appears well-developed and well-nourished. No distress.  HENT:  Head: Normocephalic and atraumatic.  Neck: Neck supple.  Pulmonary/Chest: Effort normal.  Abdominal: Soft. He exhibits no distension and no mass. There is no tenderness. There is no rebound and no guarding.  Genitourinary: Testes normal. Right testis shows no mass, no swelling and no tenderness. Right testis is descended. Left testis shows no mass, no swelling and no tenderness. Left testis is descended. Uncircumcised. No phimosis, paraphimosis, penile erythema or penile tenderness. Discharge found.  No testicular pain, mass, or erythema.  Lymphadenopathy:       Right: Inguinal adenopathy present.       Left: Inguinal adenopathy present.  Neurological: He is alert.  Skin: He is not diaphoretic.  Nursing note and vitals reviewed.   ED Course  Procedures (including critical care time)  DIAGNOSTIC STUDIES: Oxygen Saturation is 99% on room air, normal by my interpretation.    COORDINATION OF CARE: At 2340 Discussed treatment plan with patient. Patient agrees.   Labs Review Labs Reviewed  URINALYSIS, ROUTINE W REFLEX MICROSCOPIC - Abnormal; Notable for the following:    Hgb urine dipstick TRACE (*)  Leukocytes, UA MODERATE (*)    All other components within normal limits  GC/CHLAMYDIA PROBE AMP  HERPES SIMPLEX VIRUS CULTURE  URINE CULTURE  URINE MICROSCOPIC-ADD ON  HIV ANTIBODY (ROUTINE TESTING)  RPR    Imaging Review No results found.   EKG Interpretation None      MDM   Final diagnoses:  Abnormal penile discharge    Afebrile, nontoxic patient with  untreated HIV p/w dysuria and penile discharge, also with lip lesion.  Treated empirically with Azithromycin and Rocephin.  Pt notes his last CD4 count was "really high."  Has been off medications for several months.  I have given the patient multiple resources and am sending a message to our case manager to help connect him to the system.   D/C home with resources for follow up.  Discussed result, findings, treatment, and follow up  with patient.  Pt given return precautions.  Pt verbalizes understanding and agrees with plan.       I personally performed the services described in this documentation, which was scribed in my presence. The recorded information has been reviewed and is accurate.    Trixie Dredgemily Madhuri Vacca, PA-C 01/05/14 [id number]  Doug SouSam Jacubowitz, MD 01/05/14 [phone]

## 2014-01-04 NOTE — ED Notes (Signed)
Pt. reports penile discharge , dysuria and sores at perioral area onset several days ago . Denies fever or chills.

## 2014-01-04 NOTE — ED Notes (Signed)
Pt from home for eval of "blisters" noted to head of penis in addition to green penile discharge x3 days. Pt denies any n/v/d or fevers at home. Admits to being sexually active with multiple partners and uses no protection. NAD noted. No abdominal pain noted by pt.

## 2014-01-05 LAB — HIV 1/2 CONFIRMATION
HIV 1 ANTIBODY: POSITIVE — AB
HIV 2 AB: NEGATIVE

## 2014-01-05 LAB — RPR

## 2014-01-05 LAB — HIV ANTIBODY (ROUTINE TESTING W REFLEX): HIV 1&2 Ab, 4th Generation: REACTIVE — AB

## 2014-01-05 NOTE — Discharge Instructions (Signed)
Read the information below.  You may return to the Emergency Department at any time for worsening condition or any new symptoms that concern you.  You were treated for chlamydia and gonorrhea.  All of your test results are still pending.  All of your sexual partners must be tested and treated.    It is very important that you use the resources provided to get treatment for your HIV disease.    Emergency Department Resource Guide 1) Find a Doctor and Pay Out of Pocket Although you won't have to find out who is covered by your insurance plan, it is a good idea to ask around and get recommendations. You will then need to call the office and see if the doctor you have chosen will accept you as a new patient and what types of options they offer for patients who are self-pay. Some doctors offer discounts or will set up payment plans for their patients who do not have insurance, but you will need to ask so you aren't surprised when you get to your appointment.  2) Contact Your Local Health Department Not all health departments have doctors that can see patients for sick visits, but many do, so it is worth a call to see if yours does. If you don't know where your local health department is, you can check in your phone book. The CDC also has a tool to help you locate your state's health department, and many state websites also have listings of all of their local health departments.  3) Find a Walk-in Clinic If your illness is not likely to be very severe or complicated, you may want to try a walk in clinic. These are popping up all over the country in pharmacies, drugstores, and shopping centers. They're usually staffed by nurse practitioners or physician assistants that have been trained to treat common illnesses and complaints. They're usually fairly quick and inexpensive. However, if you have serious medical issues or chronic medical problems, these are probably not your best option.  No Primary Care  Doctor: - Call Health Connect at  [phone] - they can help you locate a primary care doctor that  accepts your insurance, provides certain services, etc. - Physician Referral Service- [phone]  Chronic Pain Problems: Organization         Address  Phone   Notes  [facility]  [phone] Patients need to be referred by their primary care doctor.   Medication Assistance: Organization         Address  Phone   Notes  [facility] Medication [facility] [address] [place], [place] [address] [phone]) [phone] --Must be a resident of [place] [place] -- Must have NO insurance coverage whatsoever (no Medicaid/ Medicare, etc.) -- The pt. MUST have a primary care doctor that directs their care regularly and follows them in the community   MedAssist  [phone]   [employer]  [phone]    Agencies that provide inexpensive medical care: Organization         Address  Phone   Notes  [facility]  [phone]) [phone]   [facility]    [phone]   [facility] [address] [place], [place] [address] [phone]) [phone]   [facility] [address], [place] [phone]   [facility]anned Parenthood    [phone]   [facility]    [phone]) [phone]  [facility]  [address], [place] Phone:  [phone], Fax:  [phone] Hours of Operation:  9 am - 6 pm, M-F.  Also accepts Medicaid/Medicare and self-pay.  [facility]  [address], [place] Phone: [phone], Fax: [phone]. Hours of Operation:  8:30 am - 5:30 pm, M-F.  Also accepts Medicaid and self-pay.  [facility] [place] [address], [place] Phone: [phone]) [phone]   [facility] [address], [place], [place] [phone], Ext. [phone] Mondays & Thursdays: 7-9 AM.  First 15 patients are seen on a first  come, first serve basis.    Medicaid-accepting [facility] Providers:  Organization         Address  Phone   Notes  [facility] [address], [place] [phone] Also accepts self-pay patients.  [facility] [address], [place]  [phone]) [phone]   [facility] [address], [place] [phone]) [phone]   [facility] [address] [place] [address], [place] [phone]) [phone]   Renaye RakersVeita Bland [address], [place]   [phone]) [phone] Only accepts [place] Access [place] patients after they have their name applied to their card.   Self-Pay (no insurance) in [facility] [place]:  Organization         Address  Phone   Notes  Sickle Cell Patients, [facility] [address], [place] [phone]) [phone]   [facility] [address], [place] [phone]   [facility] [place]  [address], [place] [phone]) [phone]   [facility]/Dr. Osei-Bonsu  [address] [place] [address], [place] or [id number], [place] [phone]) [phone] Phone number for both [place] and [place] locations is the same.  [facility] [address], [place] [phone]) [phone]   [facility] [place] [address] [place] [address], [place] or [address] [phone]) [phone]   [facility] [address], [place] [phone]) [phone], phone; [phone]) [phone], fax Sees patients 1st and 3rd Saturday of every month.  Must not qualify for public or private insurance (i.e. Medicaid, Medicare, [place] Health Choice, Veterans' Benefits)  Household income should be no more than 200% of the poverty level The clinic cannot treat you if you are pregnant or think you are pregnant  Sexually transmitted diseases are not treated at the clinic.    Dental Care: Organization          Address  Phone  Notes  [facility] Department of [facility] [address], [place] [phone]) [phone] Accepts children up to age 24 who are enrolled in [place]icaid or [place] Health Choice; pregnant women with a Medicaid card; and children who have applied for Medicaid or [place] Health Choice, but were declined, whose parents can pay a reduced fee at time of service.  [facility] of [facility] [place]  [address], [place] [phone] Accepts children up to age 24 who are enrolled in [place]icaid or [place] Health Choice; pregnant women with a Medicaid card; and children who have applied for Medicaid or [place] Health Choice, but were declined, whose parents can pay a reduced fee at time of service.  [facility]  [phone]  [address], [place] [phone]) [phone] Patients are seen by appointment only. Walk-ins are not accepted. [facility] will see patients 24 years of age and older. Monday - Tuesday (8am-5pm) Most Wednesdays (8:30-5pm) $30 per visit, cash only  [facility]  [address], [facility] [phone] Patients are seen by appointment only. Walk-ins are not accepted. [facility] will see patients 24 years of age and older. One Wednesday Evening (Monthly: Volunteer Based).  $30 per visit, cash only  [facility]  [phone] for adults; Children under age 154, call Graduate Pediatric Dentistry at [phone]. Children aged 464-14, please call [phone] to request a pediatric application.  Dental services are provided in all areas of dental care including fillings, crowns and bridges, complete and partial dentures, implants, gum treatment, root canals, and extractions. Preventive care is also provided. Treatment is provided to both adults and children. Patients are selected via a lottery and there is often a waiting list.   [facility] [address], [place]  [phone] [email or web address]   [facility] [address], [place], [place] [phone], Ext. [phone] Second and Fourth Thursday of each month, opens at 6:30 AM; Clinic ends at 9 AM.  Patients are seen on a first-come first-served basis, and a limited number are seen during each clinic.   [facility]  [address], [place], [place] [phone]) [phone]   Eligibility Requirements You must have lived in [place], [place], or [place] counties for at least the last three months.   You cannot be eligible for state or federal sponsored National Cityhealthcare insurance, including CIGNAVeterans Administration, [place]icaid, or [employer] EntertainmentMedicare.   You generally cannot be eligible for healthcare insurance through your employer.    How to apply: Eligibility screenings are held every Tuesday and Wednesday afternoon from 1:00 pm until 4:00 pm. You do not need an appointment for the interview!  [facility] [address], [place], [place] [phone]   [facility]  [phone]   [facility]  [phone]   [facility]  [phone]    Behavioral Health Resources in the Community: Intensive Outpatient Programs Organization         Address  Phone  Notes  [facility] [address], [place], [place] [phone]   [facility] [address], [place], [place] [phone]   [facility] [address], [place], [place]  [phone]   [facility] [address],  [place], [place] [phone] or [phone]   Substance Abuse Resources Organization         Address  Phone  Notes  [facility]  [phone]   [facility]  [phone]   [facility]  [phone]   [facility]  [phone]   Residential & Outpatient Substance Abuse Program  [phone]   Psychological  Services Organization         Address  Phone  Notes  [facility]  [phone]- [phone]   [facility]  [phone]- [phone]   [facility] [address], [place] [phone] or [phone]    Mobile Crisis Teams Organization         Address  Phone  Notes  [facility], Mobile Crisis Care Unit  [phone]   [facility]  [address] [place], [place] [phone]  [facility] [address] [place] [phone]    Self-Help/Support Groups Organization         Address  Phone             Notes  [facility] [place] - variety of support groups  336- I7437963 Call for more information  Narcotics Anonymous (NA), [facility] [address], [employer] [place]  2 meetings at this location   Statistician         Address  Phone  Notes  [facility] [address],    [place]  [phone]    [facility]  [address], [place] [address], [place], [place] [phone]   [facility] [address] [place], [place] [phone] Admissions: 8am-3pm M-F  [facility] [address]    [place], [place] [phone]   [facility] [address] [place], [place], [place] [phone]   [facility] [address],  [place], [place] [phone]   [facility] - Intensive Outpatient [address], [place], [place] [phone]   [facility] ([facility] [address]  [place], [place] [phone] or [phone]   [facility] ([facility]) [address], [place], [place] [phone] Accepts Medicaid  [facility] [place] [address]  [place] [phone] Substance Abuse/Addiction Treatment   [facility] Organization         Address  Phone  Notes  [facility]  [phone]   Angie Fava, PhD [address] [place], [place]   [phone] or [phone]   [facility]   [address] [place], [place] [phone]   [facility] [address] [place], [place] [phone] Insurance/Medicaid/sponsorship through [facility] and Families [address]                                    [place], [place] [phone] Therapy/tele-psych/case  [facility] [address], [place] [phone]    Dr. Lolly Mustache  [phone]   [facility]  [employer] [facility] Dept. 1) [address] [place] 2) [address], [place] 3)  [address], [place] [phone]  [phone]   [facility] Child Abuse Hotline 470-778-8164 or [phone] (After Hours)

## 2014-01-06 ENCOUNTER — Telehealth (HOSPITAL_BASED_OUTPATIENT_CLINIC_OR_DEPARTMENT_OTHER): Payer: Self-pay | Admitting: *Deleted

## 2014-01-06 ENCOUNTER — Telehealth: Payer: Self-pay | Admitting: Licensed Clinical Social Worker

## 2014-01-06 LAB — GC/CHLAMYDIA PROBE AMP
CT Probe RNA: NEGATIVE
GC Probe RNA: POSITIVE — AB

## 2014-01-06 LAB — HERPES SIMPLEX VIRUS CULTURE: Culture: NOT DETECTED

## 2014-01-06 LAB — URINE CULTURE
COLONY COUNT: NO GROWTH
Culture: NO GROWTH

## 2014-01-06 NOTE — Telephone Encounter (Signed)
RN from ED called to inform us of a new HIV+ patient that was not notified, and was checking to see if our ID providers received notification. Patient was tested at the ED on 01/04/14

## 2014-01-10 ENCOUNTER — Telehealth: Payer: Self-pay | Admitting: Infectious Diseases

## 2014-01-10 NOTE — Telephone Encounter (Signed)
Pt is new HIV+.  Phone does not work, please have Paramedic track pt.

## 2014-01-10 NOTE — Telephone Encounter (Signed)
Dr. Ninetta Lights has been trying to reach pt but his phone does not work he needs a Paramedic

## 2014-01-11 NOTE — Telephone Encounter (Signed)
Referred to DIS so they can find him and notify him.

## 2014-01-11 NOTE — Telephone Encounter (Signed)
Referred to DIS

## 2014-01-11 NOTE — Telephone Encounter (Signed)
excellent

## 2015-01-16 ENCOUNTER — Emergency Department (HOSPITAL_COMMUNITY): Payer: Self-pay

## 2015-01-16 ENCOUNTER — Encounter (HOSPITAL_COMMUNITY): Payer: Self-pay

## 2015-01-16 ENCOUNTER — Emergency Department (HOSPITAL_COMMUNITY)
Admission: EM | Admit: 2015-01-16 | Discharge: 2015-01-16 | Disposition: A | Discharge status: Home / Self Care | Payer: Self-pay | Attending: Emergency Medicine | Admitting: Emergency Medicine

## 2015-01-16 DIAGNOSIS — F17200 Nicotine dependence, unspecified, uncomplicated: Secondary | ICD-10-CM | POA: Insufficient documentation

## 2015-01-16 DIAGNOSIS — B20 Human immunodeficiency virus [HIV] disease: Secondary | ICD-10-CM | POA: Insufficient documentation

## 2015-01-16 DIAGNOSIS — F1110 Opioid abuse, uncomplicated: Secondary | ICD-10-CM | POA: Insufficient documentation

## 2015-01-16 DIAGNOSIS — R1013 Epigastric pain: Secondary | ICD-10-CM | POA: Insufficient documentation

## 2015-01-16 DIAGNOSIS — R112 Nausea with vomiting, unspecified: Secondary | ICD-10-CM | POA: Insufficient documentation

## 2015-01-16 DIAGNOSIS — F1210 Cannabis abuse, uncomplicated: Secondary | ICD-10-CM | POA: Insufficient documentation

## 2015-01-16 LAB — COMPREHENSIVE METABOLIC PANEL
ALK PHOS: 38 U/L (ref 38–126)
ALT: 15 U/L — AB (ref 17–63)
AST: 26 U/L (ref 15–41)
Albumin: 4.4 g/dL (ref 3.5–5.0)
Anion gap: 10 (ref 5–15)
BUN: 9 mg/dL (ref 6–20)
CALCIUM: 9.2 mg/dL (ref 8.9–10.3)
CO2: 26 mmol/L (ref 22–32)
CREATININE: 0.74 mg/dL (ref 0.61–1.24)
Chloride: 104 mmol/L (ref 101–111)
Glucose, Bld: 93 mg/dL (ref 65–99)
Potassium: 4.2 mmol/L (ref 3.5–5.1)
SODIUM: 140 mmol/L (ref 135–145)
Total Bilirubin: 0.9 mg/dL (ref 0.3–1.2)
Total Protein: 7.3 g/dL (ref 6.5–8.1)

## 2015-01-16 LAB — URINALYSIS, ROUTINE W REFLEX MICROSCOPIC
BILIRUBIN URINE: NEGATIVE
Glucose, UA: NEGATIVE mg/dL
HGB URINE DIPSTICK: NEGATIVE
KETONES UR: NEGATIVE mg/dL
Leukocytes, UA: NEGATIVE
Nitrite: NEGATIVE
PROTEIN: NEGATIVE mg/dL
Specific Gravity, Urine: >1.046 — ABNORMAL HIGH (ref 1.005–1.030)
pH: 8.5 — ABNORMAL HIGH (ref 5.0–8.0)

## 2015-01-16 LAB — CBC
HCT: 45 % (ref 39.0–52.0)
Hemoglobin: 14.9 g/dL (ref 13.0–17.0)
MCH: 28 pg (ref 26.0–34.0)
MCHC: 33.1 g/dL (ref 30.0–36.0)
MCV: 84.4 fL (ref 78.0–100.0)
PLATELETS: 179 10*3/uL (ref 150–400)
RBC: 5.33 MIL/uL (ref 4.22–5.81)
RDW: 14.3 % (ref 11.5–15.5)
WBC: 5.6 10*3/uL (ref 4.0–10.5)

## 2015-01-16 LAB — RAPID URINE DRUG SCREEN, HOSP PERFORMED
AMPHETAMINES: NOT DETECTED
Barbiturates: NOT DETECTED
Benzodiazepines: NOT DETECTED
Cocaine: NOT DETECTED
OPIATES: POSITIVE — AB
Tetrahydrocannabinol: POSITIVE — AB

## 2015-01-16 LAB — LIPASE, BLOOD: Lipase: 22 U/L (ref 11–51)

## 2015-01-16 LAB — ETHANOL: Alcohol, Ethyl (B): <5 mg/dL (ref <5)

## 2015-01-16 MED ORDER — SODIUM CHLORIDE 0.9 % IV BOLUS (SEPSIS)
1000.0000 mL | Freq: Once | INTRAVENOUS | Status: AC
  Administered 2015-01-16: 1000 mL via INTRAVENOUS

## 2015-01-16 MED ORDER — IOHEXOL 300 MG/ML  SOLN
80.0000 mL | Freq: Once | INTRAMUSCULAR | Status: AC | PRN
  Administered 2015-01-16: 100 mL via INTRAVENOUS

## 2015-01-16 MED ORDER — SODIUM CHLORIDE 0.9 % IV SOLN
80.0000 mg | Freq: Once | INTRAVENOUS | Status: AC
  Administered 2015-01-16: 80 mg via INTRAVENOUS
  Filled 2015-01-16: qty 80

## 2015-01-16 MED ORDER — MORPHINE SULFATE (PF) 2 MG/ML IV SOLN
2.0000 mg | Freq: Once | INTRAVENOUS | Status: AC
  Administered 2015-01-16: 2 mg via INTRAVENOUS
  Filled 2015-01-16: qty 1

## 2015-01-16 NOTE — ED Notes (Signed)
Pt reports abdominal pain that has been present "for years".  Pt reports the pain is in the epigastric region and sts "It's like somebody punches me in the stomach and I can't breathe or anything."  Pt reports diarrhea and emesis x2 last night.

## 2015-01-16 NOTE — ED Provider Notes (Signed)
CSN: [id number]     Arrival date & time 01/16/15  4098 History   First MD Initiated Contact with Patient 01/16/15 [phone]     Chief Complaint  Patient presents with  . Abdominal Pain     (Consider location/radiation/quality/duration/timing/severity/associated sxs/prior Treatment) HPI 25 year old male history of HIV positive presents today complaining of epigastric pain. He describes it as sharp and severe. He has had some nausea and 2 episodes of vomiting. He states he has had this pain for an extended period of time but that it worsened last night. He reports that he had approximately 1 year of treatment for his HIV when he was first diagnosed but has not been treated for the past 2-3 years. He initially states this is because he is from [place] and no one has treated him here. Review of records here revealed that he was identified and ID clinic had attempted to contact him but his phone was disconnected. He reports 2 beers occasionally but otherwise no drinking and denies illicit drug use. He is a smoker. He states he has had some weight loss. He denies fever, chills, or changes in bowel habit. He states that his bowels only move occasionally and he never feels like he has had a full bowel movement. He denies any hematemesi, coffee-ground emesis, bright red blood per rectum, dark stool, or maroon stool Past Medical History  Diagnosis Date  . HIV disease (HCC)    History reviewed. No pertinent past surgical history. History reviewed. No pertinent family history. Social History  Substance Use Topics  . Smoking status: Current Every Day Smoker  . Smokeless tobacco: None  . Alcohol Use: No    Review of Systems  All other systems reviewed and are negative.     Allergies  Review of patient's allergies indicates no known allergies.  Home Medications   Prior to Admission medications   Not on File   BP 120/68 mmHg  Pulse 70  Temp(Src) 98.7 F (37.1 C) (Oral)  Resp 18  Ht 5\' 4"   (1.626 m)  Wt 50.803 kg  BMI 19.22 kg/m2  SpO2 99% Physical Exam  Constitutional: He is oriented to person, place, and time. He appears well-developed and well-nourished. No distress.  HENT:  Head: Normocephalic and atraumatic.  Eyes: Conjunctivae are normal. Pupils are equal, round, and reactive to light.  Neck: Normal range of motion. Neck supple.  Cardiovascular: Normal rate.   Pulmonary/Chest: Effort normal and breath sounds normal.  Abdominal: Soft. Bowel sounds are normal. He exhibits no mass. There is tenderness. There is no rebound and no guarding.  Musculoskeletal: Normal range of motion.  Neurological: He is alert and oriented to person, place, and time.  Skin: Skin is warm and dry.  Psychiatric: He has a normal mood and affect.  Nursing note and vitals reviewed.   ED Course  Procedures (including critical care time) Labs Review Labs Reviewed  COMPREHENSIVE METABOLIC PANEL - Abnormal; Notable for the following:    ALT 15 (*)    All other components within normal limits  URINALYSIS, ROUTINE W REFLEX MICROSCOPIC (NOT AT [facility]) - Abnormal; Notable for the following:    Specific Gravity, Urine >1.046 (*)    pH 8.5 (*)    All other components within normal limits  URINE RAPID DRUG SCREEN, HOSP PERFORMED - Abnormal; Notable for the following:    Opiates POSITIVE (*)    Tetrahydrocannabinol POSITIVE (*)    All other components within normal limits  LIPASE, BLOOD  CBC  ETHANOL    Imaging Review No results found. I have personally reviewed and evaluated these images and lab results as part of my medical decision-making.   EKG Interpretation None      MDM   Final diagnoses:  HIV (human immunodeficiency virus infection) (HCC)  Epigastric pain    This is a 25 year old male history of HIV noncompliant with medication lost to follow-up and presents today with upper abdominal pain. Evaluation here reveals essentially normal labs and a normal CT of the abdomen and  chest x-Rosann Gorum. Patient feels improved but has not vomited here. I discussed the patient's care with Dr. Cliffton AstersJohn Campbell on call for infectious disease. He has been given the patient's phone number [phone]. I have discussed the need for follow-up the patient and he voices understanding. He'll be given the clinic number in case there is any breakdown in communication. He understands that he should call the clinic if he is not contacted to obtain follow-up and treatment. We also discussed return precautions regarding the abdominal pain and he voices understanding.    Margarita Grizzleanielle Jailee Jaquez, MD 01/21/15 [phone]

## 2015-01-16 NOTE — Discharge Instructions (Signed)
If you are not contacted by the infectious disease clinic, please contact Dr. Blair Dolphin office at [phone]  Abdominal Pain, Adult Many things can cause abdominal pain. Usually, abdominal pain is not caused by a disease and will improve without treatment. It can often be observed and treated at home. Your health care provider will do a physical exam and possibly order blood tests and X-rays to help determine the seriousness of your pain. However, in many cases, more time must pass before a clear cause of the pain can be found. Before that point, your health care provider may not know if you need more testing or further treatment. HOME CARE INSTRUCTIONS Monitor your abdominal pain for any changes. The following actions may help to alleviate any discomfort you are experiencing:  Only take over-the-counter or prescription medicines as directed by your health care provider.  Do not take laxatives unless directed to do so by your health care provider.  Try a clear liquid diet (broth, tea, or water) as directed by your health care provider. Slowly move to a bland diet as tolerated. SEEK MEDICAL CARE IF:  You have unexplained abdominal pain.  You have abdominal pain associated with nausea or diarrhea.  You have pain when you urinate or have a bowel movement.  You experience abdominal pain that wakes you in the night.  You have abdominal pain that is worsened or improved by eating food.  You have abdominal pain that is worsened with eating fatty foods.  You have a fever. SEEK IMMEDIATE MEDICAL CARE IF:  Your pain does not go away within 2 hours.  You keep throwing up (vomiting).  Your pain is felt only in portions of the abdomen, such as the right side or the left lower portion of the abdomen.  You pass bloody or black tarry stools. MAKE SURE YOU:  Understand these instructions.  Will watch your condition.  Will get help right away if you are not doing well or get worse.     This information is not intended to replace advice given to you by your health care provider. Make sure you discuss any questions you have with your health care provider.   Document Released: 10/03/2004 Document Revised: 09/14/2014 Document Reviewed: 09/02/2012 Elsevier Interactive Patient Education 2016 Elsevier Inc[employer] HIV Infection and AIDS HIV (human immunodeficiency virus) infection is a permanent (chronic) viral infection. HIV kills white blood cells that are called CD4 cells. These cells help to control your body's defense system (immune system) and fight infection. If you do not have enough CD4 cells, you can develop infections, cancers, and other health problems. If it is not treated, HIV infection advances through three phases:  Asymptomatic phase.  Early symptomatic phase.  Symptomatic phase (also known as AIDS, or acquired immunodeficiency syndrome). CAUSES HIV infection is caused by the human immunodeficiency virus. This virus is passed from one person to another person through sex, through contact with infected blood, or during childbirth or breastfeeding. RISK FACTORS  Having unprotected sex.  Sharing needles or other drug equipment. SYMPTOMS Asymptomatic Phase You may not feel sick, or you may only feel sick some of the time. Many people do not know that they have HIV in this phase. Symptoms may include:  Low-grade fever.  Rash.  Fatigue.  Sore throat.  Headaches.  Nausea, vomiting, or diarrhea.  Night sweats. Early Symptomatic Phase You may notice:  Your early symptoms getting worse or happening more often.  Oral, vaginal, or rectal sores that are caused by infections.  Problems that are related to inflammation, such as joint pain. Symptomatic Phase (AIDS) Your immune system no longer protects you from infections and other health problems. You may get opportunistic diseases, which are infections or conditions that you would not normally get if your  immune system was healthy and working properly. Problems that are caused by opportunistic diseases include:  Coughing.  Trouble breathing.  Diarrhea.  Skin sores.  Trouble swallowing.  High fevers.  Blurred vision.  Stiff neck.  Mental confusion. You may also begin to notice:  Weight loss.  Tingling or pain in your hands and feet.  Mouth sores or tooth pain.  Severe fatigue. DIAGNOSIS Your health care provider will do a screening test that looks for a chemical in your body that is produced only when it is trying to fight HIV. HIV is confirmed with another blood test. TREATMENT There is no cure for HIV infection, but there are treatments that can keep HIV from getting worse. You will be given medicines called antiretroviral therapy (ART) based on your lab tests, your medical history, past treatments for HIV, and your other health problems. ART will:  Keep your immune system as healthy as possible and help it work better.  Decrease the amount of HIV in your body.  Reduce the risk of problems caused by HIV.  Prolong your life.  Improve the quality of your life.  Help prevent passing HIV to someone else. You will need to take ART for the rest of your life. You will need to have routine lab tests performed to monitor your treatment and immune system. HOME CARE INSTRUCTIONS  See your health care provider and have your blood tested every 3-6 months to monitor your health and to make sure your treatment is working.  Take your medicines every day as directed by your health care provider.  Stop or decrease your use of alcohol, tobacco, and recreational drugs, which can cause further damage to your immune system. They can also cause problems with your liver, lungs, and heart.  Protect yourself from other sexual infections by using condoms when you have sex.  Protect yourself from other blood infections by using your own equipment if you inject, smoke, or snort drugs. Do  not share equipment.  Tell your sexual partner(s) that you have HIV. Encourage them to get tested.  Keep your vaccinations up to date. Make sure that you get all recommended vaccines, including vaccines for hepatitis A, hepatitis B, measles, and influenza.  Eat in a healthy way, exercise, and get enough sleep.  See your dentist regularly. Brush and floss you teeth every day.  See a counselor or a Child psychotherapist to help you solve problems and find any services that you need.  Get support from your family and friends. PREVENTION To prevent the spread of HIV:  Talk with your health care provider about protecting your sexual partner(s) from HIV. Your health care provider may encourage your partner(s) to take HIV medicines to decrease the risk of getting HIV. These medicines are called pre-exposure prophylaxis (PrEP).  Use a condom every time you have sexual intercourse. This includes vaginal, oral, and anal sexual activity.  The condom should be in place from the beginning to the end of sexual activity.  Use only latex or polyurethane condoms and water-based lubricants.  Wearing a condom reduces, but does not completely eliminate, your risk of spreading HIV.  Condoms also protect you from other STDs (sexually transmitted diseases).  Avoid alcohol and recreational drugs that  affect your judgment. They may make you forget to use a condom or increase your chances of participating in high-risk sex.  Do not share equipment that you use to take drugs, such as needles, syringes, cookers, tourniquets, pipes, or straws. If you share equipment, clean it before and after you use it. SEEK MEDICAL CARE IF:  You lose a lot of weight.  You have extreme fatigue.  You have trouble swallowing.  You have vomiting or diarrhea that does not get better.  You have muscle pain or joint pain.  You have any problems that are related to your medicines. SEEK IMMEDIATE MEDICAL CARE IF:  You have a rash  that causes your skin to peel.  You develop blisters inside your mouth.  You have pain in your abdomen.  You have swelling around your eyes or you have eye redness.  You have a high fever and chills.  You have shortness of breath or a cough that is dry (nonproductive) or wet (productive).  You have vision problems, such as blind spots, flashing lights, or decreased or blurred vision.  You have a persistent headache, confusion, or changes in the way that you think or see things (altered mental status).   This information is not intended to replace advice given to you by your health care provider. Make sure you discuss any questions you have with your health care provider.   Document Released: 10/08/2013 Document Revised: 05/10/2014 Document Reviewed: 10/08/2013 Elsevier Interactive Patient Education [employer]2016 Elsevier Inc.

## 2015-01-20 ENCOUNTER — Emergency Department (HOSPITAL_COMMUNITY)
Admission: EM | Admit: 2015-01-20 | Discharge: 2015-01-20 | Disposition: A | Discharge status: Home / Self Care | Payer: BLUE CROSS/BLUE SHIELD | Attending: Emergency Medicine | Admitting: Emergency Medicine

## 2015-01-20 ENCOUNTER — Encounter (HOSPITAL_COMMUNITY): Payer: Self-pay | Admitting: Emergency Medicine

## 2015-01-20 DIAGNOSIS — F17200 Nicotine dependence, unspecified, uncomplicated: Secondary | ICD-10-CM | POA: Insufficient documentation

## 2015-01-20 DIAGNOSIS — R109 Unspecified abdominal pain: Secondary | ICD-10-CM

## 2015-01-20 DIAGNOSIS — R112 Nausea with vomiting, unspecified: Secondary | ICD-10-CM | POA: Insufficient documentation

## 2015-01-20 DIAGNOSIS — R1013 Epigastric pain: Secondary | ICD-10-CM | POA: Insufficient documentation

## 2015-01-20 DIAGNOSIS — Z21 Asymptomatic human immunodeficiency virus [HIV] infection status: Secondary | ICD-10-CM | POA: Insufficient documentation

## 2015-01-20 LAB — URINALYSIS, ROUTINE W REFLEX MICROSCOPIC
Bilirubin Urine: NEGATIVE
Glucose, UA: NEGATIVE mg/dL
Hgb urine dipstick: NEGATIVE
Ketones, ur: NEGATIVE mg/dL
LEUKOCYTES UA: NEGATIVE
NITRITE: NEGATIVE
PROTEIN: 100 mg/dL — AB
SPECIFIC GRAVITY, URINE: 1.023 (ref 1.005–1.030)
pH: 8.5 — ABNORMAL HIGH (ref 5.0–8.0)

## 2015-01-20 LAB — COMPREHENSIVE METABOLIC PANEL
ALBUMIN: 4.4 g/dL (ref 3.5–5.0)
ALT: 17 U/L (ref 17–63)
AST: 27 U/L (ref 15–41)
Alkaline Phosphatase: 37 U/L — ABNORMAL LOW (ref 38–126)
Anion gap: 12 (ref 5–15)
BILIRUBIN TOTAL: 0.4 mg/dL (ref 0.3–1.2)
BUN: 10 mg/dL (ref 6–20)
CHLORIDE: 104 mmol/L (ref 101–111)
CO2: 22 mmol/L (ref 22–32)
CREATININE: 0.86 mg/dL (ref 0.61–1.24)
Calcium: 9.8 mg/dL (ref 8.9–10.3)
Glucose, Bld: 102 mg/dL — ABNORMAL HIGH (ref 65–99)
POTASSIUM: 3.4 mmol/L — AB (ref 3.5–5.1)
SODIUM: 138 mmol/L (ref 135–145)
TOTAL PROTEIN: 7.3 g/dL (ref 6.5–8.1)

## 2015-01-20 LAB — LIPASE, BLOOD: Lipase: 23 U/L (ref 11–51)

## 2015-01-20 LAB — CBC WITH DIFFERENTIAL/PLATELET
BASOS ABS: 0.1 10*3/uL (ref 0.0–0.1)
Basophils Relative: 1 %
EOS ABS: 0.2 10*3/uL (ref 0.0–0.7)
EOS PCT: 2 %
HCT: 44.3 % (ref 39.0–52.0)
HEMOGLOBIN: 14.9 g/dL (ref 13.0–17.0)
LYMPHS ABS: 2.4 10*3/uL (ref 0.7–4.0)
Lymphocytes Relative: 30 %
MCH: 27.9 pg (ref 26.0–34.0)
MCHC: 33.6 g/dL (ref 30.0–36.0)
MCV: 82.8 fL (ref 78.0–100.0)
Monocytes Absolute: 0.6 10*3/uL (ref 0.1–1.0)
Monocytes Relative: 7 %
NEUTROS PCT: 60 %
Neutro Abs: 4.9 10*3/uL (ref 1.7–7.7)
PLATELETS: 200 10*3/uL (ref 150–400)
RBC: 5.35 MIL/uL (ref 4.22–5.81)
RDW: 14 % (ref 11.5–15.5)
WBC: 8.1 10*3/uL (ref 4.0–10.5)

## 2015-01-20 LAB — URINE MICROSCOPIC-ADD ON: RBC / HPF: NONE SEEN RBC/hpf (ref 0–5)

## 2015-01-20 MED ORDER — SODIUM CHLORIDE 0.9 % IV BOLUS (SEPSIS)
1000.0000 mL | Freq: Once | INTRAVENOUS | Status: AC
  Administered 2015-01-20: 1000 mL via INTRAVENOUS

## 2015-01-20 MED ORDER — DICYCLOMINE HCL 20 MG PO TABS: 20.0000 mg | ORAL_TABLET | Freq: Two times a day (BID) | ORAL | Status: DC

## 2015-01-20 MED ORDER — MORPHINE SULFATE (PF) 2 MG/ML IV SOLN
2.0000 mg | Freq: Once | INTRAVENOUS | Status: AC
  Administered 2015-01-20: 2 mg via INTRAVENOUS
  Filled 2015-01-20: qty 1

## 2015-01-20 MED ORDER — PANTOPRAZOLE SODIUM 40 MG IV SOLR
40.0000 mg | Freq: Once | INTRAVENOUS | Status: AC
  Administered 2015-01-20: 40 mg via INTRAVENOUS
  Filled 2015-01-20: qty 40

## 2015-01-20 MED ORDER — MORPHINE SULFATE (PF) 4 MG/ML IV SOLN
4.0000 mg | Freq: Once | INTRAVENOUS | Status: AC
  Administered 2015-01-20: 4 mg via INTRAVENOUS
  Filled 2015-01-20: qty 1

## 2015-01-20 MED ORDER — PROMETHAZINE HCL 25 MG/ML IJ SOLN
25.0000 mg | Freq: Once | INTRAMUSCULAR | Status: AC
  Administered 2015-01-20: 25 mg via INTRAVENOUS
  Filled 2015-01-20: qty 1

## 2015-01-20 MED ORDER — ONDANSETRON HCL 4 MG/2ML IJ SOLN
4.0000 mg | Freq: Once | INTRAMUSCULAR | Status: AC
  Administered 2015-01-20: 4 mg via INTRAVENOUS
  Filled 2015-01-20: qty 2

## 2015-01-20 MED ORDER — PROMETHAZINE HCL 25 MG PO TABS: 25.0000 mg | ORAL_TABLET | Freq: Four times a day (QID) | ORAL | Status: DC | PRN

## 2015-01-20 NOTE — ED Notes (Signed)
Patient called out for more pain medication, PA Victory DakinRiley made aware of patient's request.

## 2015-01-20 NOTE — ED Notes (Signed)
Pt reports upper abdominal pain, was seen here on the 9th for same. Pt reports pain has increased since then. Pt has appt with pcp on 1/18 but stated he could not wait until then.

## 2015-01-20 NOTE — ED Notes (Signed)
Upon administration of morphine, patient stated "4mg  is not going to do anything for me." this RN advised patient that the PA has only ordered 4mg  at this time and that we will reassess his pain shortly after medication is given and this RN will let the PA know if pain medication does not help. Pt also had episode of vomiting after morphine given, pt advised he felt better after vomiting and did not want any medication for it.

## 2015-01-20 NOTE — ED Provider Notes (Signed)
CSN: [id number]     Arrival date & time 01/20/15  1028 History   First MD Initiated Contact with Patient 01/20/15 1037     Chief Complaint  Patient presents with  . Abdominal Pain   HPI Carl Barber is a 25 y.o. M PMH significant for HIV presenting with epigastric abdominal pain, nausea and vomiting. He was seen here for similar complaints on 01/16/15 with a negative workup and a referral to ID. He describes his pain as non-radiating, stabbing, 10/10 pain scale, constant, worse with vomiting. He denies fevers, chills, CP, SOB, hematemesis, change in bowel/bladder habits, hematochezia.   Past Medical History  Diagnosis Date  . HIV disease (HCC)    History reviewed. No pertinent past surgical history. No family history on file. Social History  Substance Use Topics  . Smoking status: Current Every Day Smoker  . Smokeless tobacco: None  . Alcohol Use: No    Review of Systems  Ten systems are reviewed and are negative for acute change except as noted in the HPI  Allergies  Review of patient's allergies indicates no known allergies.  Home Medications   Prior to Admission medications   Not on File   BP 118/71 mmHg  Pulse 77  Temp(Src) 98 F (36.7 C) (Oral)  Resp 20  SpO2 98% Physical Exam  Constitutional: He appears well-developed and well-nourished. No distress.  HENT:  Head: Normocephalic and atraumatic.  Mouth/Throat: Oropharynx is clear and moist. No oropharyngeal exudate.  Eyes: Conjunctivae are normal. Pupils are equal, round, and reactive to light. Right eye exhibits no discharge. Left eye exhibits no discharge. No scleral icterus.  Neck: No tracheal deviation present.  Cardiovascular: Normal rate, regular rhythm, normal heart sounds and intact distal pulses.  Exam reveals no gallop and no friction rub.   No murmur heard. Pulmonary/Chest: Effort normal and breath sounds normal. No respiratory distress. He has no wheezes. He has no rales. He exhibits no  tenderness.  Abdominal: Soft. Bowel sounds are normal. He exhibits no distension and no mass. There is tenderness. There is no rebound and no guarding.  Epigastric abdominal tenderness  Musculoskeletal: He exhibits no edema.  Lymphadenopathy:    He has no cervical adenopathy.  Neurological: He is alert. Coordination normal.  Skin: Skin is warm and dry. No rash noted. He is not diaphoretic. No erythema.  Psychiatric: He has a normal mood and affect. His behavior is normal.  Nursing note and vitals reviewed.   ED Course  Procedures  Labs Review Labs Reviewed  COMPREHENSIVE METABOLIC PANEL - Abnormal; Notable for the following:    Potassium 3.4 (*)    Glucose, Bld 102 (*)    Alkaline Phosphatase 37 (*)    All other components within normal limits  URINALYSIS, ROUTINE W REFLEX MICROSCOPIC (NOT AT [facility]) - Abnormal; Notable for the following:    pH 8.5 (*)    Protein, ur 100 (*)    All other components within normal limits  URINE MICROSCOPIC-ADD ON - Abnormal; Notable for the following:    Squamous Epithelial / LPF 0-5 (*)    Bacteria, UA FEW (*)    All other components within normal limits  LIPASE, BLOOD  CBC WITH DIFFERENTIAL/PLATELET   MDM   Final diagnoses:  Abdominal pain, unspecified abdominal location  Nausea and vomiting, vomiting of unspecified type   Unchanged epigastric pain, N/V in noncompliant HIV patient. Pain most likely MSK vs PUD vs gastroenteritis. Labs at baseline. Negative CT abdomen/pelvis on 01/16/15.  Patient  feeling better after phenergan, PPI, morphine.  Patient may be safely discharged home with bentyl. Discussed reasons for return. Patient to follow-up with ID next week. Patient in understanding and agreement with the plan.   Melton Krebs, PA-C 01/22/15 2030  Bethann Berkshire, MD 01/24/15 [phone]

## 2015-01-20 NOTE — ED Notes (Signed)
Pt now requesting "a tube down his nose to his stomach," for pain relief, This RN advised patient that NG tubes are done only when medically necessary, not simply for pain relief. PA Victory DakinRiley made aware of patient's request.

## 2015-01-20 NOTE — ED Notes (Signed)
Pt called out stating that he is still having pain and that he needs something to eat. RN made patient aware that since he has been vomiting we need to hold off on food at this time until that is under control. Pt states his pain is from not eating. PA Victory DakinRiley made aware of situation.

## 2015-01-20 NOTE — Discharge Instructions (Signed)
Mr. Reita ChardLeonard Palacio Fohl,  Nice meeting you! Please follow-up with infectious disease. Return to the emergency department if you are unable to keep foods down, your pain increases. Feel better soon!  S. Lane HackerNicole Aldridge Krzyzanowski, PA-C  Abdominal Pain, Adult Many things can cause abdominal pain. Usually, abdominal pain is not caused by a disease and will improve without treatment. It can often be observed and treated at home. Your health care provider will do a physical exam and possibly order blood tests and X-rays to help determine the seriousness of your pain. However, in many cases, more time must pass before a clear cause of the pain can be found. Before that point, your health care provider may not know if you need more testing or further treatment. HOME CARE INSTRUCTIONS Monitor your abdominal pain for any changes. The following actions may help to alleviate any discomfort you are experiencing:  Only take over-the-counter or prescription medicines as directed by your health care provider.  Do not take laxatives unless directed to do so by your health care provider.  Try a clear liquid diet (broth, tea, or water) as directed by your health care provider. Slowly move to a bland diet as tolerated. SEEK MEDICAL CARE IF:  You have unexplained abdominal pain.  You have abdominal pain associated with nausea or diarrhea.  You have pain when you urinate or have a bowel movement.  You experience abdominal pain that wakes you in the night.  You have abdominal pain that is worsened or improved by eating food.  You have abdominal pain that is worsened with eating fatty foods.  You have a fever. SEEK IMMEDIATE MEDICAL CARE IF:  Your pain does not go away within 2 hours.  You keep throwing up (vomiting).  Your pain is felt only in portions of the abdomen, such as the right side or the left lower portion of the abdomen.  You pass bloody or black tarry stools. MAKE SURE YOU:  Understand these  instructions.  Will watch your condition.  Will get help right away if you are not doing well or get worse.   This information is not intended to replace advice given to you by your health care provider. Make sure you discuss any questions you have with your health care provider.   Document Released: 10/03/2004 Document Revised: 09/14/2014 Document Reviewed: 09/02/2012 Elsevier Interactive Patient Education [employer] Elsevier Inc.

## 2015-01-21 ENCOUNTER — Encounter (HOSPITAL_COMMUNITY): Payer: Self-pay | Admitting: Emergency Medicine

## 2015-01-21 ENCOUNTER — Emergency Department (HOSPITAL_COMMUNITY)
Admission: EM | Admit: 2015-01-21 | Discharge: 2015-01-21 | Discharge status: Against Medical Advice | Payer: BLUE CROSS/BLUE SHIELD | Attending: Emergency Medicine | Admitting: Emergency Medicine

## 2015-01-21 ENCOUNTER — Emergency Department (HOSPITAL_COMMUNITY)
Admission: EM | Admit: 2015-01-21 | Discharge: 2015-01-21 | Disposition: A | Discharge status: Home / Self Care | Payer: BLUE CROSS/BLUE SHIELD | Attending: Emergency Medicine | Admitting: Emergency Medicine

## 2015-01-21 DIAGNOSIS — Z79899 Other long term (current) drug therapy: Secondary | ICD-10-CM | POA: Insufficient documentation

## 2015-01-21 DIAGNOSIS — B20 Human immunodeficiency virus [HIV] disease: Secondary | ICD-10-CM | POA: Insufficient documentation

## 2015-01-21 DIAGNOSIS — R109 Unspecified abdominal pain: Secondary | ICD-10-CM | POA: Insufficient documentation

## 2015-01-21 DIAGNOSIS — R112 Nausea with vomiting, unspecified: Secondary | ICD-10-CM

## 2015-01-21 DIAGNOSIS — R509 Fever, unspecified: Secondary | ICD-10-CM | POA: Insufficient documentation

## 2015-01-21 DIAGNOSIS — R1013 Epigastric pain: Secondary | ICD-10-CM | POA: Insufficient documentation

## 2015-01-21 DIAGNOSIS — F17210 Nicotine dependence, cigarettes, uncomplicated: Secondary | ICD-10-CM | POA: Insufficient documentation

## 2015-01-21 DIAGNOSIS — F17200 Nicotine dependence, unspecified, uncomplicated: Secondary | ICD-10-CM | POA: Insufficient documentation

## 2015-01-21 DIAGNOSIS — G8929 Other chronic pain: Secondary | ICD-10-CM | POA: Insufficient documentation

## 2015-01-21 LAB — COMPREHENSIVE METABOLIC PANEL
ALT: 17 U/L (ref 17–63)
ALT: 18 U/L (ref 17–63)
AST: 29 U/L (ref 15–41)
AST: 33 U/L (ref 15–41)
Albumin: 4.6 g/dL (ref 3.5–5.0)
Albumin: 4.9 g/dL (ref 3.5–5.0)
Alkaline Phosphatase: 40 U/L (ref 38–126)
Alkaline Phosphatase: 41 U/L (ref 38–126)
Anion gap: 16 — ABNORMAL HIGH (ref 5–15)
Anion gap: 15 (ref 5–15)
BUN: 9 mg/dL (ref 6–20)
BUN: 12 mg/dL (ref 6–20)
CHLORIDE: 102 mmol/L (ref 101–111)
CO2: 21 mmol/L — AB (ref 22–32)
CO2: 24 mmol/L (ref 22–32)
CREATININE: 0.75 mg/dL (ref 0.61–1.24)
Calcium: 9.8 mg/dL (ref 8.9–10.3)
Calcium: 10 mg/dL (ref 8.9–10.3)
Chloride: 99 mmol/L — ABNORMAL LOW (ref 101–111)
Creatinine, Ser: 0.88 mg/dL (ref 0.61–1.24)
GFR calc Af Amer: >60 mL/min (ref >60)
GFR calc Af Amer: >60 mL/min (ref >60)
GFR calc non Af Amer: >60 mL/min (ref >60)
Glucose, Bld: 133 mg/dL — ABNORMAL HIGH (ref 65–99)
Glucose, Bld: 103 mg/dL — ABNORMAL HIGH (ref 65–99)
POTASSIUM: 3.9 mmol/L (ref 3.5–5.1)
Potassium: 3.5 mmol/L (ref 3.5–5.1)
Sodium: 139 mmol/L (ref 135–145)
Sodium: 138 mmol/L (ref 135–145)
Total Bilirubin: 0.6 mg/dL (ref 0.3–1.2)
Total Bilirubin: 0.8 mg/dL (ref 0.3–1.2)
Total Protein: 7.8 g/dL (ref 6.5–8.1)
Total Protein: 8.1 g/dL (ref 6.5–8.1)

## 2015-01-21 LAB — CBC WITH DIFFERENTIAL/PLATELET
Basophils Absolute: 0 10*3/uL (ref 0.0–0.1)
Basophils Relative: 0 %
Eosinophils Absolute: 0 10*3/uL (ref 0.0–0.7)
Eosinophils Relative: 0 %
HCT: 44.5 % (ref 39.0–52.0)
Hemoglobin: 15.1 g/dL (ref 13.0–17.0)
Lymphocytes Relative: 27 %
Lymphs Abs: 2.5 10*3/uL (ref 0.7–4.0)
MCH: 27.7 pg (ref 26.0–34.0)
MCHC: 33.9 g/dL (ref 30.0–36.0)
MCV: 81.7 fL (ref 78.0–100.0)
Monocytes Absolute: 0.7 10*3/uL (ref 0.1–1.0)
Monocytes Relative: 7 %
Neutro Abs: 6.2 10*3/uL (ref 1.7–7.7)
Neutrophils Relative %: 66 %
Platelets: 204 10*3/uL (ref 150–400)
RBC: 5.45 MIL/uL (ref 4.22–5.81)
RDW: 13.7 % (ref 11.5–15.5)
WBC: 9.4 10*3/uL (ref 4.0–10.5)

## 2015-01-21 LAB — URINE MICROSCOPIC-ADD ON
Bacteria, UA: NONE SEEN
RBC / HPF: NONE SEEN RBC/hpf (ref 0–5)

## 2015-01-21 LAB — CBC
HEMATOCRIT: 42.1 % (ref 39.0–52.0)
Hemoglobin: 14.1 g/dL (ref 13.0–17.0)
MCH: 27.5 pg (ref 26.0–34.0)
MCHC: 33.5 g/dL (ref 30.0–36.0)
MCV: 82.1 fL (ref 78.0–100.0)
PLATELETS: 194 10*3/uL (ref 150–400)
RBC: 5.13 MIL/uL (ref 4.22–5.81)
RDW: 13.9 % (ref 11.5–15.5)
WBC: 7.6 10*3/uL (ref 4.0–10.5)

## 2015-01-21 LAB — LIPASE, BLOOD: Lipase: 19 U/L (ref 11–51)

## 2015-01-21 LAB — URINALYSIS, ROUTINE W REFLEX MICROSCOPIC
Bilirubin Urine: NEGATIVE
Glucose, UA: NEGATIVE mg/dL
Hgb urine dipstick: NEGATIVE
Ketones, ur: 15 mg/dL — AB
Leukocytes, UA: NEGATIVE
Nitrite: NEGATIVE
Protein, ur: 30 mg/dL — AB
Specific Gravity, Urine: 1.024 (ref 1.005–1.030)
pH: 8 (ref 5.0–8.0)

## 2015-01-21 LAB — I-STAT CG4 LACTIC ACID, ED
Lactic Acid, Venous: 4.27 mmol/L (ref 0.5–2.0)
Lactic Acid, Venous: 0.99 mmol/L (ref 0.5–2.0)

## 2015-01-21 MED ORDER — FAMOTIDINE IN NACL 20-0.9 MG/50ML-% IV SOLN
20.0000 mg | Freq: Once | INTRAVENOUS | Status: AC
  Administered 2015-01-21: 20 mg via INTRAVENOUS
  Filled 2015-01-21: qty 50

## 2015-01-21 MED ORDER — SODIUM CHLORIDE 0.9 % IV BOLUS (SEPSIS)
1000.0000 mL | Freq: Once | INTRAVENOUS | Status: AC
  Administered 2015-01-21: 1000 mL via INTRAVENOUS

## 2015-01-21 MED ORDER — ONDANSETRON HCL 4 MG PO TABS: 4.0000 mg | ORAL_TABLET | Freq: Four times a day (QID) | ORAL | Status: DC

## 2015-01-21 MED ORDER — MORPHINE SULFATE (PF) 4 MG/ML IV SOLN
4.0000 mg | Freq: Once | INTRAVENOUS | Status: AC
  Administered 2015-01-21: 4 mg via INTRAVENOUS
  Filled 2015-01-21: qty 1

## 2015-01-21 MED ORDER — FAMOTIDINE 20 MG PO TABS: 20.0000 mg | ORAL_TABLET | Freq: Two times a day (BID) | ORAL | Status: DC

## 2015-01-21 MED ORDER — PANTOPRAZOLE SODIUM 40 MG IV SOLR
40.0000 mg | Freq: Once | INTRAVENOUS | Status: AC
  Administered 2015-01-21: 40 mg via INTRAVENOUS
  Filled 2015-01-21: qty 40

## 2015-01-21 MED ORDER — HYDROCODONE-ACETAMINOPHEN 5-325 MG PO TABS
1.0000 | ORAL_TABLET | Freq: Four times a day (QID) | ORAL | Status: DC | PRN
Start: 2015-01-21 — End: 2015-07-30

## 2015-01-21 MED ORDER — PANTOPRAZOLE SODIUM 20 MG PO TBEC: 20.0000 mg | DELAYED_RELEASE_TABLET | Freq: Every day | ORAL | Status: DC

## 2015-01-21 NOTE — ED Notes (Signed)
Pt needs interpreter line

## 2015-01-21 NOTE — ED Notes (Signed)
Pt very anxious and demanding spanish speaking interpreter. Pt speaking clear english and comprehending conversations with RN. RN informed pt she will obtain interpreter.

## 2015-01-21 NOTE — ED Notes (Signed)
Patient here with continued abdominal pain. Similar to visit from yesterday, but states pain is much worse now. Patient inconsolable.

## 2015-01-21 NOTE — ED Provider Notes (Signed)
CSN: [id number]     Arrival date & time 01/21/15  1018 History   First MD Initiated Contact with Patient 01/21/15 1056     Chief Complaint  Patient presents with  . Fever  . Emesis  . Abdominal Pain   Spanish interpreter used  HPI  Carl Barber is a 25 year old male with PMHx of HIV presenting with abdominal pain, nausea and vomiting. Has had chronic epigastric pain for multiple years but reports that it worsened approximately one week ago. Carl Barber states the pain is an aching in his epigastrium. Associated symptoms include nausea and vomiting. Carl Barber states that these are new symptoms over the past week. Carl Barber states that the abdominal pain has been so severe that Carl Barber has not been able to eat or drink for the past 3 days. Carl Barber denies blood in his vomit or stool. Carl Barber has been seen twice in [facility] for same complaint with negative CT and blood work. Carl Barber reports significant relief from protonix but was not discharged home with PPI or H2 blocked. Carl Barber returns to the ED today "because the medicine they give here is the only thing that works". Carl Barber denies acute changes in his symptoms today. Carl Barber has been HIV positive for over 4 years but reports Carl Barber has not received treatment in approximately 3 years. Carl Barber states that Carl Barber has an infectious disease appointment in 4 days to restart therapy.  Carl Barber has never followed with a gastroenterologist. Carl Barber does not take daily reflux medications. Carl Barber has tried Phenergan and Bentyl without relief. Denies fevers, chills, headaches, dizziness, syncope, cold symptoms, chest pain, SOB, cough, change in bowel movements, dysuria, hematuria or back pain.   Past Medical History  Diagnosis Date  . HIV disease (HCC)    History reviewed. No pertinent past surgical history. No family history on file. Social History  Substance Use Topics  . Smoking status: Current Every Day Smoker  . Smokeless tobacco: None  . Alcohol Use: No    Review of Systems  Constitutional: Negative for fever and chills.  HENT:  Negative.   Eyes: Negative for visual disturbance.  Respiratory: Negative for cough and shortness of breath.   Cardiovascular: Negative for chest pain.  Gastrointestinal: Positive for nausea, vomiting and abdominal pain. Negative for blood in stool and abdominal distention.  Genitourinary: Negative for dysuria and flank pain.  Musculoskeletal: Negative for back pain and neck pain.  Skin: Negative for rash.  Neurological: Negative for dizziness, syncope and headaches.  All other systems reviewed and are negative.     Allergies  Review of patient's allergies indicates no known allergies.  Home Medications   Prior to Admission medications   Medication Sig Start Date End Date Taking? Authorizing Provider  dicyclomine (BENTYL) 20 MG tablet Take 1 tablet (20 mg total) by mouth 2 (two) times daily. 01/20/15  Yes Melton Krebs, PA-C  promethazine (PHENERGAN) 25 MG tablet Take 1 tablet (25 mg total) by mouth every 6 (six) hours as needed for nausea or vomiting. 01/20/15  Yes Melton Krebs, PA-C  famotidine (PEPCID) 20 MG tablet Take 1 tablet (20 mg total) by mouth 2 (two) times daily. 01/21/15   Zephyr Ridley, PA-C  HYDROcodone-acetaminophen (NORCO/VICODIN) 5-325 MG tablet Take 1 tablet by mouth every 6 (six) hours as needed. 01/21/15   Legacy Carrender, PA-C  ondansetron (ZOFRAN) 4 MG tablet Take 1 tablet (4 mg total) by mouth every 6 (six) hours. 01/21/15   Rashunda Passon, PA-C  pantoprazole (PROTONIX) 20 MG tablet Take  1 tablet (20 mg total) by mouth daily. 01/21/15   Maximillion Gill, PA-C   BP 149/102 mmHg  Pulse 73  Temp(Src) 98.9 F (37.2 C) (Oral)  Resp 16  Ht 5\' 4"  (1.626 m)  Wt 46.012 kg  BMI 17.40 kg/m2  SpO2 100% Physical Exam  Constitutional: Carl Barber appears well-developed and well-nourished. No distress.  HENT:  Head: Normocephalic and atraumatic.  Eyes: Conjunctivae are normal. Right eye exhibits no discharge. Left eye exhibits no discharge. No scleral icterus.  Neck:  Normal range of motion.  Cardiovascular: Normal rate and regular rhythm.   Pulmonary/Chest: Effort normal. No respiratory distress.  Abdominal: Soft. Normal appearance. There is tenderness in the epigastric area. There is no rigidity, no rebound, no guarding and no CVA tenderness.    Musculoskeletal: Normal range of motion.  Neurological: Carl Barber is alert. Coordination normal.  Skin: Skin is warm and dry.  Psychiatric: Carl Barber has a normal mood and affect. His behavior is normal.  Nursing note and vitals reviewed.   ED Course  Procedures (including critical care time) Labs Review Labs Reviewed  COMPREHENSIVE METABOLIC PANEL - Abnormal; Notable for the following:    Chloride 99 (*)    Glucose, Bld 103 (*)    All other components within normal limits  URINALYSIS, ROUTINE W REFLEX MICROSCOPIC (NOT AT [facility]) - Abnormal; Notable for the following:    Ketones, ur 15 (*)    Protein, ur 30 (*)    All other components within normal limits  URINE MICROSCOPIC-ADD ON - Abnormal; Notable for the following:    Squamous Epithelial / LPF 0-5 (*)    All other components within normal limits  I-STAT CG4 LACTIC ACID, ED - Abnormal; Notable for the following:    Lactic Acid, Venous 4.27 (*)    All other components within normal limits  URINE CULTURE  CBC WITH DIFFERENTIAL/PLATELET  I-STAT CG4 LACTIC ACID, ED    Imaging Review No results found. I have personally reviewed and evaluated these images and lab results as part of my medical decision-making.   EKG Interpretation None     1:45 - Re-assessed pt after 2 L IVF and pain meds. Pt reports significant improvement in symptoms after morphine, protonix and pepcid. Abdomen is soft with mild tenderness in epigastrium. No peritoneal signs. Pt is requesting food and drink. Will PO challenge and repeat lactic acid.   2:45 - Repeat lactic acid 0.99. Pt is tolerating food and drink without exacerbation of abdominal pain. On re-examination, abdomen is soft with  mild tenderness in epigastrium. No peritoneal signs. Pt is stable for discharge home with follow up at his ID clinic in 4 days.   MDM   Final diagnoses:  Epigastric pain  Non-intractable vomiting with nausea, vomiting of unspecified type   25 year old male presenting with acute worsening of chronic epigastric pain. Associated symptoms include nausea and vomiting. Pt has never been evaluated by GI. Carl Barber has been non-compliant with HIV medications but Carl Barber has an appt with ID in 4 days. Pt hypertensive in triage; repeat BP 135/83. Patient is nontoxic, nonseptic appearing, in no apparent distress. Abdomen is soft with tenderness in epigastric region. No peritoneal signs suggesting surgical abdomen. Patient's pain and other symptoms adequately managed in emergency department with morphine, protonix and pepcid. Fluid boluses given. Labs, imaging and vitals reviewed. Pt lactic acid 4.27. Remaining blood work unchanged from previous visits. After 2 L bolus, lactic acid 0.99. Elevation likely due to uncontrolled vomiting and dehydration. Symptoms are unchanged  from first ED visit so no indication for repeat CT. Patient does not meet the SIRS or Sepsis criteria. Repeat abdominal exam before discharge with significant improvement. Pt is tolerating PO intake; Carl Barber has had [place] sandwich, crackers and drink. Patient discharged home with symptomatic treatment and given strict instructions for follow-up with his ID doctor. Discussed that Carl Barber may need gastroenterology follow up for chronic abdominal pain.  I have also discussed reasons to return immediately to the ER.  Patient expresses understanding and agrees with plan.      Alveta HeimlichStevi Trinady Milewski, PA-C 01/21/15 1543  Raeford RazorStephen Kohut, MD 01/22/15 [phone]

## 2015-01-21 NOTE — ED Notes (Signed)
Resp. called @ [phone]

## 2015-01-21 NOTE — ED Notes (Signed)
Nurse first informed by [employer] that patient had left.

## 2015-01-21 NOTE — ED Notes (Signed)
Pt states he is hungry and resting food. Notified PA. Ok for pt to eat. Gave pt [place] sandwich, crackers and water

## 2015-01-21 NOTE — Discharge Instructions (Signed)
It is very important to keep your follow up visit with your infectious disease doctor. You may also need to be seen by a gastroenterologist for your chronic abdominal pain. The zofran is for nausea. Take protonix and pepcid every day as prescribed. Take the vicodin if the protonix and pepcid do not control your pain.    Abdominal Pain, Adult Many things can cause abdominal pain. Usually, abdominal pain is not caused by a disease and will improve without treatment. It can often be observed and treated at home. Your health care provider will do a physical exam and possibly order blood tests and X-rays to help determine the seriousness of your pain. However, in many cases, more time must pass before a clear cause of the pain can be found. Before that point, your health care provider may not know if you need more testing or further treatment. HOME CARE INSTRUCTIONS Monitor your abdominal pain for any changes. The following actions may help to alleviate any discomfort you are experiencing:  Only take over-the-counter or prescription medicines as directed by your health care provider.  Do not take laxatives unless directed to do so by your health care provider.  Try a clear liquid diet (broth, tea, or water) as directed by your health care provider. Slowly move to a bland diet as tolerated. SEEK MEDICAL CARE IF:  You have unexplained abdominal pain.  You have abdominal pain associated with nausea or diarrhea.  You have pain when you urinate or have a bowel movement.  You experience abdominal pain that wakes you in the night.  You have abdominal pain that is worsened or improved by eating food.  You have abdominal pain that is worsened with eating fatty foods.  You have a fever. SEEK IMMEDIATE MEDICAL CARE IF:  Your pain does not go away within 2 hours.  You keep throwing up (vomiting).  Your pain is felt only in portions of the abdomen, such as the right side or the left lower portion of  the abdomen.  You pass bloody or black tarry stools. MAKE SURE YOU:  Understand these instructions.  Will watch your condition.  Will get help right away if you are not doing well or get worse.   This information is not intended to replace advice given to you by your health care provider. Make sure you discuss any questions you have with your health care provider.   Document Released: 10/03/2004 Document Revised: 09/14/2014 Document Reviewed: 09/02/2012 Elsevier Interactive Patient Education 2016 Elsevier Inc.  Nausea and Vomiting Nausea is a sick feeling that often comes before throwing up (vomiting). Vomiting is a reflex where stomach contents come out of your mouth. Vomiting can cause severe loss of body fluids (dehydration). Children and elderly adults can become dehydrated quickly, especially if they also have diarrhea. Nausea and vomiting are symptoms of a condition or disease. It is important to find the cause of your symptoms. CAUSES   Direct irritation of the stomach lining. This irritation can result from increased acid production (gastroesophageal reflux disease), infection, food poisoning, taking certain medicines (such as nonsteroidal anti-inflammatory drugs), alcohol use, or tobacco use.  Signals from the brain.These signals could be caused by a headache, heat exposure, an inner ear disturbance, increased pressure in the brain from injury, infection, a tumor, or a concussion, pain, emotional stimulus, or metabolic problems.  An obstruction in the gastrointestinal tract (bowel obstruction).  Illnesses such as diabetes, hepatitis, gallbladder problems, appendicitis, kidney problems, cancer, sepsis, atypical symptoms of a  heart attack, or eating disorders.  Medical treatments such as chemotherapy and radiation.  Receiving medicine that makes you sleep (general anesthetic) during surgery. DIAGNOSIS Your caregiver may ask for tests to be done if the problems do not improve  after a few days. Tests may also be done if symptoms are severe or if the reason for the nausea and vomiting is not clear. Tests may include:  Urine tests.  Blood tests.  Stool tests.  Cultures (to look for evidence of infection).  X-rays or other imaging studies. Test results can help your caregiver make decisions about treatment or the need for additional tests. TREATMENT You need to stay well hydrated. Drink frequently but in small amounts.You may wish to drink water, sports drinks, clear broth, or eat frozen ice pops or gelatin dessert to help stay hydrated.When you eat, eating slowly may help prevent nausea.There are also some antinausea medicines that may help prevent nausea. HOME CARE INSTRUCTIONS   Take all medicine as directed by your caregiver.  If you do not have an appetite, do not force yourself to eat. However, you must continue to drink fluids.  If you have an appetite, eat a normal diet unless your caregiver tells you differently.  Eat a variety of complex carbohydrates (rice, wheat, potatoes, bread), lean meats, yogurt, fruits, and vegetables.  Avoid high-fat foods because they are more difficult to digest.  Drink enough water and fluids to keep your urine clear or pale yellow.  If you are dehydrated, ask your caregiver for specific rehydration instructions. Signs of dehydration may include:  Severe thirst.  Dry lips and mouth.  Dizziness.  Dark urine.  Decreasing urine frequency and amount.  Confusion.  Rapid breathing or pulse. SEEK IMMEDIATE MEDICAL CARE IF:   You have blood or brown flecks (like coffee grounds) in your vomit.  You have black or bloody stools.  You have a severe headache or stiff neck.  You are confused.  You have severe abdominal pain.  You have chest pain or trouble breathing.  You do not urinate at least once every 8 hours.  You develop cold or clammy skin.  You continue to vomit for longer than 24 to 48  hours.  You have a fever. MAKE SURE YOU:   Understand these instructions.  Will watch your condition.  Will get help right away if you are not doing well or get worse.   This information is not intended to replace advice given to you by your health care provider. Make sure you discuss any questions you have with your health care provider.   Document Released: 12/24/2004 Document Revised: 03/18/2011 Document Reviewed: 05/23/2010 Elsevier Interactive Patient Education [employer]2016 Elsevier Inc.

## 2015-01-21 NOTE — ED Notes (Signed)
Pt c/o fever, abdominal pain, vomiting x 4 days. Pt has been coming to ED for same for past few days. Family brings pt because the medication he gets here makes him feel better. Prescribed medications not effective. Family feels protonix and morphine was effective. Pt unable to tolerate PO food and fluids.

## 2015-01-23 LAB — URINE CULTURE

## 2015-01-25 ENCOUNTER — Encounter: Payer: Self-pay | Admitting: *Deleted

## 2015-01-25 ENCOUNTER — Ambulatory Visit (INDEPENDENT_AMBULATORY_CARE_PROVIDER_SITE_OTHER): Payer: Self-pay | Admitting: Infectious Diseases

## 2015-01-25 ENCOUNTER — Encounter: Payer: Self-pay | Admitting: Infectious Diseases

## 2015-01-25 DIAGNOSIS — R1013 Epigastric pain: Secondary | ICD-10-CM

## 2015-01-25 DIAGNOSIS — B20 Human immunodeficiency virus [HIV] disease: Secondary | ICD-10-CM | POA: Insufficient documentation

## 2015-01-25 DIAGNOSIS — Z79899 Other long term (current) drug therapy: Secondary | ICD-10-CM

## 2015-01-25 DIAGNOSIS — Z113 Encounter for screening for infections with a predominantly sexual mode of transmission: Secondary | ICD-10-CM

## 2015-01-25 DIAGNOSIS — R109 Unspecified abdominal pain: Secondary | ICD-10-CM | POA: Insufficient documentation

## 2015-01-25 LAB — LIPID PANEL
Cholesterol: 130 mg/dL (ref 125–200)
HDL: 32 mg/dL — AB (ref >=40)
LDL CALC: 83 mg/dL (ref <130)
Total CHOL/HDL Ratio: 4.1 Ratio (ref <=5.0)
Triglycerides: 73 mg/dL (ref <150)
VLDL: 15 mg/dL (ref <30)

## 2015-01-25 LAB — COMPREHENSIVE METABOLIC PANEL
ALK PHOS: 37 U/L — AB (ref 40–115)
ALT: 12 U/L (ref 9–46)
AST: 17 U/L (ref 10–40)
Albumin: 4.5 g/dL (ref 3.6–5.1)
BUN: 11 mg/dL (ref 7–25)
CALCIUM: 9.5 mg/dL (ref 8.6–10.3)
CHLORIDE: 101 mmol/L (ref 98–110)
CO2: 30 mmol/L (ref 20–31)
Creat: 0.72 mg/dL (ref 0.60–1.35)
Glucose, Bld: 62 mg/dL — ABNORMAL LOW (ref 65–99)
POTASSIUM: 4.2 mmol/L (ref 3.5–5.3)
Sodium: 139 mmol/L (ref 135–146)
TOTAL PROTEIN: 7.3 g/dL (ref 6.1–8.1)
Total Bilirubin: 0.3 mg/dL (ref 0.2–1.2)

## 2015-01-25 LAB — CBC
HEMATOCRIT: 44.8 % (ref 39.0–52.0)
Hemoglobin: 14.8 g/dL (ref 13.0–17.0)
MCH: 27.5 pg (ref 26.0–34.0)
MCHC: 33 g/dL (ref 30.0–36.0)
MCV: 83.3 fL (ref 78.0–100.0)
MPV: 10.2 fL (ref 8.6–12.4)
PLATELETS: 222 10*3/uL (ref 150–400)
RBC: 5.38 MIL/uL (ref 4.22–5.81)
RDW: 14.8 % (ref 11.5–15.5)
WBC: 5.6 10*3/uL (ref 4.0–10.5)

## 2015-01-25 MED ORDER — FLUCONAZOLE 100 MG PO TABS: 100.0000 mg | ORAL_TABLET | Freq: Every day | ORAL | Status: DC

## 2015-01-25 MED ORDER — ABACAVIR-DOLUTEGRAVIR-LAMIVUD 600-50-300 MG PO TABS: 1.0000 | ORAL_TABLET | Freq: Every day | ORAL | Status: DC

## 2015-01-25 NOTE — Assessment & Plan Note (Addendum)
Will check his labs today Will get him in for ADAP today Will start him back on triumeq asap Will encourage him to get HPV and flu vax (he refuses) Not sexually active, Offered/refused condoms.  Will see him back in 2 weeks.

## 2015-01-25 NOTE — Progress Notes (Signed)
   Subjective:    Patient ID: Carl Barber, male    DOB: [birth year], 25 y.o.   MRN: [id number]  HPI 25 yo with HIV+, has been seen in ED several times recently for abd pain. Has had n/v ~ 15x over 1 week.  No blood in emesis or stool. CT abd negative. Had temp up to 104 during this period. Pain unchanged with eating.  1-9, 1-13, 1-4. Believes he has an ulcer.  Has been HIV+ since 2011. Was started on triumeq in [place]. Had to move to [place] as his job ended. Has been off meds for 3-4 years.  Has lost 15#.   PMHx, FHx (parents healthy), Sochx updated.   Review of Systems  Constitutional: Positive for unexpected weight change. Negative for fever, chills and appetite change.  HENT: Negative for mouth sores and trouble swallowing.   Gastrointestinal: Positive for abdominal pain. Negative for diarrhea, constipation and blood in stool.  Genitourinary: Negative for difficulty urinating and genital sores.       Objective:   Physical Exam  Constitutional: He appears well-developed and well-nourished.  HENT:  Mouth/Throat: No oropharyngeal exudate.  Eyes: EOM are normal. Pupils are equal, round, and reactive to light.  Neck: Neck supple.  Cardiovascular: Normal rate, regular rhythm and normal heart sounds.   Pulmonary/Chest: Effort normal and breath sounds normal.  Abdominal: Soft. Bowel sounds are normal. There is no tenderness. There is no rebound.  Musculoskeletal: He exhibits no edema.  Lymphadenopathy:    He has no cervical adenopathy.      Assessment & Plan:

## 2015-01-25 NOTE — Assessment & Plan Note (Signed)
He has a few areas of oral erythema.  Will treat him for thrush. Hopefully not [place]

## 2015-01-26 ENCOUNTER — Other Ambulatory Visit: Payer: Self-pay | Admitting: *Deleted

## 2015-01-26 DIAGNOSIS — B20 Human immunodeficiency virus [HIV] disease: Secondary | ICD-10-CM

## 2015-01-26 LAB — URINALYSIS, ROUTINE W REFLEX MICROSCOPIC
Bilirubin Urine: NEGATIVE
Glucose, UA: NEGATIVE
Hgb urine dipstick: NEGATIVE
Ketones, ur: NEGATIVE
LEUKOCYTES UA: NEGATIVE
Nitrite: NEGATIVE
PROTEIN: NEGATIVE
Specific Gravity, Urine: 1.015 (ref 1.001–1.035)
pH: 8 (ref 5.0–8.0)

## 2015-01-26 LAB — RPR

## 2015-01-26 LAB — HEPATITIS B SURFACE ANTIGEN: HEP B S AG: NEGATIVE

## 2015-01-26 LAB — HEPATITIS B SURFACE ANTIBODY,QUALITATIVE: HEP B S AB: POSITIVE — AB

## 2015-01-26 LAB — HEPATITIS C ANTIBODY: HCV AB: NEGATIVE

## 2015-01-26 LAB — HEPATITIS A ANTIBODY, TOTAL: HEP A TOTAL AB: REACTIVE — AB

## 2015-01-26 LAB — HEPATITIS B CORE ANTIBODY, TOTAL: Hep B Core Total Ab: NONREACTIVE

## 2015-01-26 MED ORDER — ABACAVIR-DOLUTEGRAVIR-LAMIVUD 600-50-300 MG PO TABS: 1.0000 | ORAL_TABLET | Freq: Every day | ORAL | Status: DC

## 2015-01-27 LAB — URINE CYTOLOGY ANCILLARY ONLY
Chlamydia: NEGATIVE
Neisseria Gonorrhea: NEGATIVE

## 2015-01-27 LAB — HIV-1 RNA ULTRAQUANT REFLEX TO GENTYP+
HIV 1 RNA QUANT: 63355 {copies}/mL — AB (ref <20)
HIV-1 RNA QUANT, LOG: 4.8 {Log_copies}/mL — AB (ref <1.30)

## 2015-01-27 LAB — T-HELPER CELL (CD4) - (RCID CLINIC ONLY)
CD4 T CELL ABS: 320 /uL — AB (ref 400–2700)
CD4 T CELL HELPER: 13 % — AB (ref 33–55)

## 2015-01-31 LAB — HLA B*5701: HLA-B 5701 W/RFLX HLA-B HIGH: NEGATIVE

## 2015-02-04 LAB — HIV-1 INTEGRASE GENOTYPE

## 2015-02-04 LAB — HIV-1 GENOTYPR PLUS

## 2015-02-08 ENCOUNTER — Encounter: Payer: Self-pay | Admitting: Infectious Diseases

## 2015-02-08 ENCOUNTER — Ambulatory Visit (INDEPENDENT_AMBULATORY_CARE_PROVIDER_SITE_OTHER): Payer: Self-pay | Admitting: Infectious Diseases

## 2015-02-08 VITALS — BP 104/75 | HR 93 | Temp 98.3°F | Wt 108.0 lb

## 2015-02-08 DIAGNOSIS — Z113 Encounter for screening for infections with a predominantly sexual mode of transmission: Secondary | ICD-10-CM

## 2015-02-08 DIAGNOSIS — Z23 Encounter for immunization: Secondary | ICD-10-CM

## 2015-02-08 DIAGNOSIS — Z79899 Other long term (current) drug therapy: Secondary | ICD-10-CM

## 2015-02-08 DIAGNOSIS — B20 Human immunodeficiency virus [HIV] disease: Secondary | ICD-10-CM

## 2015-02-08 DIAGNOSIS — R1013 Epigastric pain: Secondary | ICD-10-CM

## 2015-02-08 NOTE — Progress Notes (Signed)
   Subjective:    Patient ID: Carl Barber, male    DOB: Aug 30, 1990, 25 y.o.   MRN: [id number]  HPI 25 yo M with hx of HIV+ since 2011. He has been treated with triumeq, had gap when moving to [place] (3-4 years).  Has been feeling better. Has had occas headache- throbbing, only when he positions his head forward, down.  Has not been able to get diflucan filled due to cost.  Has gained 7#.   HIV 1 RNA QUANT (copies/mL)  Date Value  01/25/2015 63355*   CD4 T CELL ABS (/uL)  Date Value  01/25/2015 320*   Has questions about marijuana use.  Pruritis, no rash.   Review of Systems  Constitutional: Negative for appetite change and unexpected weight change.  Gastrointestinal: Positive for abdominal pain. Negative for diarrhea and constipation.  Genitourinary: Positive for dysuria and frequency. Negative for hematuria and genital sores.  Neurological: Positive for headaches.      Objective:   Physical Exam  Constitutional: He appears well-developed and well-nourished.  HENT:  Mouth/Throat: No oropharyngeal exudate.  Eyes: EOM are normal.  Neck: Neck supple.  Cardiovascular: Normal rate, regular rhythm and normal heart sounds.   Pulmonary/Chest: Effort normal and breath sounds normal.  Abdominal: Soft. Bowel sounds are normal. There is no tenderness. There is no rebound.  Lymphadenopathy:    He has no cervical adenopathy.      Assessment & Plan:

## 2015-02-08 NOTE — Assessment & Plan Note (Signed)
Mild, occasional.  Will watch, consider GI eval. Unchanged with food.

## 2015-02-08 NOTE — Assessment & Plan Note (Signed)
Start HPV series.  Given condoms Taking art well Prev thrush resolved.  Will repeat his labs in 3-4 months

## 2015-02-08 NOTE — Addendum Note (Signed)
Addended by: Luella Cook E on: 02/08/2015 02:20 PM   Modules accepted: Orders

## 2015-02-17 ENCOUNTER — Emergency Department (HOSPITAL_COMMUNITY)
Admission: EM | Admit: 2015-02-17 | Discharge: 2015-02-17 | Disposition: A | Discharge status: Home / Self Care | Payer: Self-pay | Attending: Emergency Medicine | Admitting: Emergency Medicine

## 2015-02-17 ENCOUNTER — Encounter (HOSPITAL_COMMUNITY): Payer: Self-pay | Admitting: Oncology

## 2015-02-17 DIAGNOSIS — Z21 Asymptomatic human immunodeficiency virus [HIV] infection status: Secondary | ICD-10-CM | POA: Insufficient documentation

## 2015-02-17 DIAGNOSIS — Z79899 Other long term (current) drug therapy: Secondary | ICD-10-CM | POA: Insufficient documentation

## 2015-02-17 DIAGNOSIS — F1210 Cannabis abuse, uncomplicated: Secondary | ICD-10-CM | POA: Insufficient documentation

## 2015-02-17 DIAGNOSIS — R112 Nausea with vomiting, unspecified: Secondary | ICD-10-CM

## 2015-02-17 DIAGNOSIS — R1013 Epigastric pain: Secondary | ICD-10-CM | POA: Insufficient documentation

## 2015-02-17 DIAGNOSIS — F17200 Nicotine dependence, unspecified, uncomplicated: Secondary | ICD-10-CM | POA: Insufficient documentation

## 2015-02-17 DIAGNOSIS — F12988 Cannabis use, unspecified with other cannabis-induced disorder: Secondary | ICD-10-CM

## 2015-02-17 LAB — COMPREHENSIVE METABOLIC PANEL
ALBUMIN: 4.7 g/dL (ref 3.5–5.0)
ALK PHOS: 37 U/L — AB (ref 38–126)
ALT: 18 U/L (ref 17–63)
AST: 23 U/L (ref 15–41)
Anion gap: 13 (ref 5–15)
BILIRUBIN TOTAL: 0.7 mg/dL (ref 0.3–1.2)
BUN: 12 mg/dL (ref 6–20)
CALCIUM: 9.7 mg/dL (ref 8.9–10.3)
CO2: 20 mmol/L — AB (ref 22–32)
CREATININE: 0.68 mg/dL (ref 0.61–1.24)
Chloride: 105 mmol/L (ref 101–111)
GFR calc non Af Amer: >60 mL/min (ref >60)
GLUCOSE: 129 mg/dL — AB (ref 65–99)
Potassium: 3.5 mmol/L (ref 3.5–5.1)
SODIUM: 138 mmol/L (ref 135–145)
Total Protein: 7.9 g/dL (ref 6.5–8.1)

## 2015-02-17 LAB — CBC WITH DIFFERENTIAL/PLATELET
BASOS ABS: 0 10*3/uL (ref 0.0–0.1)
BASOS PCT: 0 %
EOS ABS: 0.4 10*3/uL (ref 0.0–0.7)
EOS PCT: 3 %
HCT: 45.1 % (ref 39.0–52.0)
Hemoglobin: 15.2 g/dL (ref 13.0–17.0)
Lymphocytes Relative: 20 %
Lymphs Abs: 2.8 10*3/uL (ref 0.7–4.0)
MCH: 27.7 pg (ref 26.0–34.0)
MCHC: 33.7 g/dL (ref 30.0–36.0)
MCV: 82.3 fL (ref 78.0–100.0)
MONO ABS: 1.1 10*3/uL — AB (ref 0.1–1.0)
Monocytes Relative: 8 %
Neutro Abs: 9.4 10*3/uL — ABNORMAL HIGH (ref 1.7–7.7)
Neutrophils Relative %: 69 %
PLATELETS: 220 10*3/uL (ref 150–400)
RBC: 5.48 MIL/uL (ref 4.22–5.81)
RDW: 14.2 % (ref 11.5–15.5)
WBC: 13.7 10*3/uL — AB (ref 4.0–10.5)

## 2015-02-17 LAB — CBG MONITORING, ED: GLUCOSE-CAPILLARY: 119 mg/dL — AB (ref 65–99)

## 2015-02-17 LAB — URINALYSIS, ROUTINE W REFLEX MICROSCOPIC
Bilirubin Urine: NEGATIVE
GLUCOSE, UA: NEGATIVE mg/dL
HGB URINE DIPSTICK: NEGATIVE
Ketones, ur: 15 mg/dL — AB
Leukocytes, UA: NEGATIVE
Nitrite: NEGATIVE
Protein, ur: NEGATIVE mg/dL
SPECIFIC GRAVITY, URINE: 1.012 (ref 1.005–1.030)
pH: 7.5 (ref 5.0–8.0)

## 2015-02-17 LAB — RAPID URINE DRUG SCREEN, HOSP PERFORMED
AMPHETAMINES: NOT DETECTED
Barbiturates: NOT DETECTED
Benzodiazepines: NOT DETECTED
Cocaine: NOT DETECTED
Opiates: NOT DETECTED
TETRAHYDROCANNABINOL: POSITIVE — AB

## 2015-02-17 LAB — LIPASE, BLOOD: Lipase: 20 U/L (ref 11–51)

## 2015-02-17 LAB — I-STAT CG4 LACTIC ACID, ED: LACTIC ACID, VENOUS: 2.14 mmol/L — AB (ref 0.5–2.0)

## 2015-02-17 MED ORDER — ACETAMINOPHEN 500 MG PO TABS
1000.0000 mg | ORAL_TABLET | Freq: Once | ORAL | Status: AC
  Administered 2015-02-17: 1000 mg via ORAL
  Filled 2015-02-17: qty 2

## 2015-02-17 MED ORDER — SODIUM CHLORIDE 0.9 % IV BOLUS (SEPSIS)
1000.0000 mL | Freq: Once | INTRAVENOUS | Status: AC
  Administered 2015-02-17: 1000 mL via INTRAVENOUS

## 2015-02-17 MED ORDER — HALOPERIDOL LACTATE 5 MG/ML IJ SOLN
5.0000 mg | Freq: Once | INTRAMUSCULAR | Status: AC
  Administered 2015-02-17: 5 mg via INTRAVENOUS
  Filled 2015-02-17: qty 1

## 2015-02-17 NOTE — ED Notes (Signed)
Knott, EDP made aware of patient lactic result. 

## 2015-02-17 NOTE — ED Provider Notes (Signed)
CSN: [id number]     Arrival date & time 02/17/15  2031 History   First MD Initiated Contact with Patient 02/17/15 2038     Chief Complaint  Patient presents with  . Emesis     (Consider location/radiation/quality/duration/timing/severity/associated sxs/prior Treatment) Patient is a 25 y.o. male presenting with vomiting. The history is provided by the patient and a friend.  Emesis Severity:  Moderate Duration:  1 day Timing:  Constant Quality:  Stomach contents Able to tolerate:  Liquids Progression:  Worsening Chronicity:  Recurrent Recent urination:  Normal Relieved by:  Nothing Worsened by:  Nothing tried Ineffective treatments:  None tried Associated symptoms: abdominal pain (epigastric)   Associated symptoms: no cough, no diarrhea and no fever   Risk factors comment:  Heavy marijuana smoking   Past Medical History  Diagnosis Date  . HIV disease (HCC)   . Abdominal pain    History reviewed. No pertinent past surgical history. No family history on file. Social History  Substance Use Topics  . Smoking status: Current Every Day Smoker  . Smokeless tobacco: None  . Alcohol Use: No    Review of Systems  Gastrointestinal: Positive for vomiting and abdominal pain (epigastric). Negative for diarrhea.  All other systems reviewed and are negative.     Allergies  Review of patient's allergies indicates no known allergies.  Home Medications   Prior to Admission medications   Medication Sig Start Date End Date Taking? Authorizing Provider  abacavir-dolutegravir-lamiVUDine (TRIUMEQ) 600-50-300 MG tablet Take 1 tablet by mouth daily. 01/26/15  Yes Judyann Munson, MD  dicyclomine (BENTYL) 20 MG tablet Take 1 tablet (20 mg total) by mouth 2 (two) times daily. 01/20/15  Yes Melton Krebs, PA-C  famotidine (PEPCID) 20 MG tablet Take 1 tablet (20 mg total) by mouth 2 (two) times daily. 01/21/15  Yes Stevi Barrett, PA-C  HYDROcodone-acetaminophen (NORCO/VICODIN) 5-325 MG  tablet Take 1 tablet by mouth every 6 (six) hours as needed. 01/21/15  Yes Stevi Barrett, PA-C  ondansetron (ZOFRAN) 4 MG tablet Take 1 tablet (4 mg total) by mouth every 6 (six) hours. 01/21/15  Yes Stevi Barrett, PA-C  pantoprazole (PROTONIX) 20 MG tablet Take 1 tablet (20 mg total) by mouth daily. 01/21/15  Yes Stevi Barrett, PA-C   BP 123/84 mmHg  Pulse 82  Temp(Src) 97.7 F (36.5 C) (Oral)  Resp 22  Ht  (1.626 m)  Wt 110 lb (49.896 kg)  BMI 18.87 kg/m2  SpO2 100% Physical Exam  Constitutional: He is oriented to person, place, and time. He appears well-developed and well-nourished. No distress.  HENT:  Head: Normocephalic and atraumatic.  Eyes: Conjunctivae are normal.  Neck: Neck supple. No tracheal deviation present.  Cardiovascular: Normal rate, regular rhythm and normal heart sounds.   Pulmonary/Chest: Effort normal and breath sounds normal. No respiratory distress. He has no wheezes. He has no rales.  Abdominal: Soft. He exhibits no distension. There is tenderness (mild epigastric). There is no rebound and no guarding.  Neurological: He is alert and oriented to person, place, and time.  Skin: Skin is warm and dry.  Psychiatric: He has a normal mood and affect.  Vitals reviewed.   ED Course  Procedures (including critical care time) Labs Review Labs Reviewed  CBC WITH DIFFERENTIAL/PLATELET - Abnormal; Notable for the following:    WBC 13.7 (*)    Neutro Abs 9.4 (*)    Monocytes Absolute 1.1 (*)    All other components within normal limits  COMPREHENSIVE METABOLIC PANEL -  Abnormal; Notable for the following:    CO2 20 (*)    Glucose, Bld 129 (*)    Alkaline Phosphatase 37 (*)    All other components within normal limits  URINALYSIS, ROUTINE W REFLEX MICROSCOPIC (NOT AT [facility]) - Abnormal; Notable for the following:    Ketones, ur 15 (*)    All other components within normal limits  URINE RAPID DRUG SCREEN, HOSP PERFORMED - Abnormal; Notable for the following:     Tetrahydrocannabinol POSITIVE (*)    All other components within normal limits  I-STAT CG4 LACTIC ACID, ED - Abnormal; Notable for the following:    Lactic Acid, Venous 2.14 (*)    All other components within normal limits  CBG MONITORING, ED - Abnormal; Notable for the following:    Glucose-Capillary 119 (*)    All other components within normal limits  LIPASE, BLOOD    Imaging Review No results found. I have personally reviewed and evaluated these images and lab results as part of my medical decision-making.   EKG Interpretation None      MDM   Final diagnoses:  Cannabinoid hyperemesis syndrome (HCC)    25 y.o. male presents with recurrent emesis and abdominal pain with history of heavy cannabis use c/w hyperemesis 2/2 drug abuse. Discussed this likelihood with Pt who has had multiple negative workups including negative CT. Discussed haldol as potential therapy for cyclic vomiting and Pt in agreement to proceed. Vomiting stopped after meds, patient able to tolerate PO, given tylenol for refractory pain and has reassuring exam. Labs unremarkable except mild leukocytosis and lactic acid elevation which is likely 2/2 vomiting and mild dehydration. No fevers, focal tenderness, or signs of surgical illness currently. Patient needs to establish primary care in the area and was provided contact information to do so. Discussed abstaining from further abuse of cannabis to prevent recurrence.     Lyndal Pulley, MD 02/18/15 [phone]

## 2015-02-17 NOTE — ED Notes (Signed)
Pt c/o persistent vomiting since 1400 today.  Pt is a chronic marijuana user and has been seen for the same in the past.  Dr. Clydene Pugh at the bedside.

## 2015-02-17 NOTE — Progress Notes (Signed)
EDCM spoke to patient at bedside. Patient confirms he does not have a pcp or insurance living in [place].  EDCM provided patient with contact information to [facility], informed patient of services there and walk in times.  EDCM also provided patient with list of pcps who accept self pay patients, list of discount pharmacies and websites needymeds.org and GoodRX.com for medication assistance, phone number to inquire about the orange card, phone number to inquire about Mediciad, phone number to inquire about the Affordable Care Act, financial resources in the community such as local churches, salvation [employer], urban ministries, and dental assistance for uninsured patients.  Patient thankful for resources.  No further EDCM needs at this time. 

## 2015-02-17 NOTE — Discharge Instructions (Signed)
Cannabis Use Disorder Cannabis use disorder is a mental disorder. It is not one-time or occasional use of cannabis, more commonly known as marijuana. Cannabis use disorder is the continued, nonmedical use of cannabis that interferes with normal life activities or causes health problems. People with cannabis use disorder get a feeling of extreme pleasure and relaxation from cannabis use. This "high" is very rewarding and causes people to use over and over.  The mind-altering ingredient in cannabis is know as THC. THC can also interfere with motor coordination, memory, judgment, and accurate sense of space and time. These effects can last for a few days after using cannabis. Regular heavy cannabis use can cause long-lasting problems with thinking and learning. In young people, these problems may be permanent. Cannabis sometimes causes severe anxiety, paranoia, or visual hallucinations. Man-made (synthetic) cannabis-like drugs, such as "spice" and "K2," cause the same effects as THC but are much stronger. Cannabis-like drugs can cause dangerously high blood pressure and heart rate.  Cannabis use disorder usually starts in the teenage years. It can trigger the development of schizophrenia. It is somewhat more common in men than women. People who have family members with the disorder or existing mental health issues such as depression and posttraumatic stress disorderare more likely to develop cannabis use disorder. People with cannabis use disorder are at higher risk for use of other drugs of abuse.  SIGNS AND SYMPTOMS Signs and symptoms of cannabis use disorder include:   Use of cannabis in larger amounts or over a longer period than intended.   Unsuccessful attempts to cut down or control cannabis use.   A lot of time spent obtaining, using, or recovering from the effects of cannabis.   A strong desire or urge to use cannabis (cravings).   Continued use of cannabis in spite of problems at work,  school, or home because of use.   Continued use of cannabis in spite of relationship problems because of use.  Giving up or cutting down on important life activities because of cannabis use.  Use of cannabis over and over even in situations when it is physically hazardous, such as when driving a car.   Continued use of cannabis in spite of a physical problem that is likely related to use. Physical problems can include:  Chronic cough.  Bronchitis.  Emphysema.  Throat and lung cancer.  Continued use of cannabis in spite of a mental problem that is likely related to use. Mental problems can include:  Psychosis.  Anxiety.  Difficulty sleeping.  Need to use more and more cannabis to get the same effect, or lessened effect over time with use of the same amount (tolerance).  Having withdrawal symptoms when cannabis use is stopped, or using cannabis to reduce or avoid withdrawal symptoms. Withdrawal symptoms include:  Irritability or anger.  Anxiety or restlessness.  Difficulty sleeping.  Loss of appetite or weight.  Aches and pains.  Shakiness.  Sweating.  Chills. DIAGNOSIS Cannabis use disorder is diagnosed by your health care provider. You may be asked questions about your cannabis use and how it affects your life. A physical exam may be done. A drug screen may be done. You may be referred to a mental health professional. The diagnosis of cannabis use disorder requires at least two symptoms within 12 months. The type of cannabis use disorder you have depends on the number of symptoms you have. The type may be:  Mild. Two or three signs and symptoms.   Moderate. Four or   five signs and symptoms.   Severe. Six or more signs and symptoms.  TREATMENT Treatment is usually provided by mental health professionals with training in substance use disorders. The following options are available:  Counseling or talk therapy. Talk therapy addresses the reasons you use  cannabis. It also addresses ways to keep you from using again. The goals of talk therapy include:  Identifying and avoiding triggers for use.  Learning how to handle cravings.  Replacing use with healthy activities.  Support groups. Support groups provide emotional support, advice, and guidance.  Medicine. Medicine is used to treat mental health issues that trigger cannabis use or that result from it. HOME CARE INSTRUCTIONS  Take medicines only as directed by your health care provider.  Check with your health care provider before starting any new medicines.  Keep all follow-up visits as directed by your health care provider. SEEK MEDICAL CARE IF:  You are not able to take your medicines as directed.  Your symptoms get worse. SEEK IMMEDIATE MEDICAL CARE IF: You have serious thoughts about hurting yourself or others. FOR MORE INFORMATION  National Institute on Drug Abuse: www.drugabuse.gov  Substance Abuse and Mental Health Services Administration: www.samhsa.gov   This information is not intended to replace advice given to you by your health care provider. Make sure you discuss any questions you have with your health care provider.   Document Released: 12/22/1999 Document Revised: 01/14/2014 Document Reviewed: 01/06/2013 Elsevier Interactive Patient Education 2016 Elsevier Inc.  

## 2015-02-17 NOTE — ED Notes (Signed)
Pt. has urinal unable to urinate at this time.

## 2015-03-06 ENCOUNTER — Other Ambulatory Visit: Payer: Self-pay | Admitting: *Deleted

## 2015-03-06 DIAGNOSIS — B20 Human immunodeficiency virus [HIV] disease: Secondary | ICD-10-CM

## 2015-03-06 MED ORDER — ABACAVIR-DOLUTEGRAVIR-LAMIVUD 600-50-300 MG PO TABS
1.0000 | ORAL_TABLET | Freq: Every day | ORAL | Status: DC
Start: 2015-03-06 — End: 2015-08-26

## 2015-03-07 ENCOUNTER — Other Ambulatory Visit: Payer: Self-pay | Admitting: *Deleted

## 2015-03-07 NOTE — Telephone Encounter (Signed)
Refill done 03/06/15.

## 2015-05-13 ENCOUNTER — Encounter (HOSPITAL_COMMUNITY): Payer: Self-pay | Admitting: Emergency Medicine

## 2015-05-13 ENCOUNTER — Emergency Department (HOSPITAL_COMMUNITY)
Admission: EM | Admit: 2015-05-13 | Discharge: 2015-05-14 | Disposition: A | Discharge status: Home / Self Care | Payer: Self-pay | Attending: Emergency Medicine | Admitting: Emergency Medicine

## 2015-05-13 DIAGNOSIS — F17200 Nicotine dependence, unspecified, uncomplicated: Secondary | ICD-10-CM | POA: Insufficient documentation

## 2015-05-13 DIAGNOSIS — E876 Hypokalemia: Secondary | ICD-10-CM | POA: Insufficient documentation

## 2015-05-13 DIAGNOSIS — Z79899 Other long term (current) drug therapy: Secondary | ICD-10-CM | POA: Insufficient documentation

## 2015-05-13 DIAGNOSIS — R1084 Generalized abdominal pain: Secondary | ICD-10-CM | POA: Insufficient documentation

## 2015-05-13 DIAGNOSIS — R112 Nausea with vomiting, unspecified: Secondary | ICD-10-CM

## 2015-05-13 DIAGNOSIS — Z21 Asymptomatic human immunodeficiency virus [HIV] infection status: Secondary | ICD-10-CM | POA: Insufficient documentation

## 2015-05-13 LAB — CBC
HCT: 45.3 % (ref 39.0–52.0)
Hemoglobin: 15.4 g/dL (ref 13.0–17.0)
MCH: 28.8 pg (ref 26.0–34.0)
MCHC: 34 g/dL (ref 30.0–36.0)
MCV: 84.8 fL (ref 78.0–100.0)
Platelets: 206 10*3/uL (ref 150–400)
RBC: 5.34 MIL/uL (ref 4.22–5.81)
RDW: 14.5 % (ref 11.5–15.5)
WBC: 15 10*3/uL — ABNORMAL HIGH (ref 4.0–10.5)

## 2015-05-13 LAB — COMPREHENSIVE METABOLIC PANEL
ALT: 16 U/L — ABNORMAL LOW (ref 17–63)
AST: 27 U/L (ref 15–41)
Albumin: 5 g/dL (ref 3.5–5.0)
Alkaline Phosphatase: 41 U/L (ref 38–126)
Anion gap: 16 — ABNORMAL HIGH (ref 5–15)
BUN: 11 mg/dL (ref 6–20)
CO2: 18 mmol/L — ABNORMAL LOW (ref 22–32)
Calcium: 9.4 mg/dL (ref 8.9–10.3)
Chloride: 99 mmol/L — ABNORMAL LOW (ref 101–111)
Creatinine, Ser: 0.79 mg/dL (ref 0.61–1.24)
GFR calc Af Amer: >60 mL/min (ref >60)
GFR calc non Af Amer: >60 mL/min (ref >60)
Glucose, Bld: 130 mg/dL — ABNORMAL HIGH (ref 65–99)
Potassium: 2.9 mmol/L — ABNORMAL LOW (ref 3.5–5.1)
Sodium: 133 mmol/L — ABNORMAL LOW (ref 135–145)
Total Bilirubin: 0.6 mg/dL (ref 0.3–1.2)
Total Protein: 8 g/dL (ref 6.5–8.1)

## 2015-05-13 LAB — LIPASE, BLOOD: Lipase: 20 U/L (ref 11–51)

## 2015-05-13 MED ORDER — ONDANSETRON 4 MG PO TBDP
4.0000 mg | ORAL_TABLET | Freq: Once | ORAL | Status: AC | PRN
  Administered 2015-05-13: 4 mg via ORAL
  Filled 2015-05-13: qty 1

## 2015-05-13 MED ORDER — HALOPERIDOL LACTATE 5 MG/ML IJ SOLN
3.0000 mg | Freq: Once | INTRAMUSCULAR | Status: AC
  Administered 2015-05-13: 3 mg via INTRAVENOUS
  Filled 2015-05-13: qty 1

## 2015-05-13 MED ORDER — FAMOTIDINE IN NACL 20-0.9 MG/50ML-% IV SOLN
20.0000 mg | Freq: Once | INTRAVENOUS | Status: AC
  Administered 2015-05-13: 20 mg via INTRAVENOUS
  Filled 2015-05-13: qty 50

## 2015-05-13 MED ORDER — SODIUM CHLORIDE 0.9 % IV BOLUS (SEPSIS)
1000.0000 mL | Freq: Once | INTRAVENOUS | Status: AC
  Administered 2015-05-13: 1000 mL via INTRAVENOUS

## 2015-05-13 MED ORDER — ONDANSETRON HCL 4 MG/2ML IJ SOLN
4.0000 mg | Freq: Once | INTRAMUSCULAR | Status: AC
  Administered 2015-05-13: 4 mg via INTRAVENOUS
  Filled 2015-05-13: qty 2

## 2015-05-13 MED ORDER — PANTOPRAZOLE SODIUM 40 MG IV SOLR
40.0000 mg | Freq: Once | INTRAVENOUS | Status: AC
  Administered 2015-05-13: 40 mg via INTRAVENOUS
  Filled 2015-05-13: qty 40

## 2015-05-13 MED ORDER — GI COCKTAIL ~~LOC~~
30.0000 mL | Freq: Once | ORAL | Status: AC
  Administered 2015-05-13: 30 mL via ORAL
  Filled 2015-05-13: qty 30

## 2015-05-13 NOTE — ED Notes (Signed)
Pt from home with complaints of generalized abdominal pain that he thinks is related to a stomach ulcer. Pt states the pain is constant and burns. Pt states he has been unable to eat anything. Pt states he has taken his medication for his ulcer, but was not able to hold it down, and pt is unable to remember the name of the medication. Pt denies any diarrhea and pt denies urinary symptoms

## 2015-05-13 NOTE — ED Notes (Signed)
MD made aware pt was unable to tolerate GI cocktail, subsequently vomiting.

## 2015-05-14 MED ORDER — POTASSIUM CHLORIDE CRYS ER 20 MEQ PO TBCR
60.0000 meq | EXTENDED_RELEASE_TABLET | Freq: Once | ORAL | Status: AC
  Administered 2015-05-14: 60 meq via ORAL
  Filled 2015-05-14: qty 3

## 2015-05-14 NOTE — Discharge Instructions (Signed)
Hypokalemia Hypokalemia means that the amount of potassium in the blood is lower than normal.Potassium is a chemical, called an electrolyte, that helps regulate the amount of fluid in the body. It also stimulates muscle contraction and helps nerves function properly.Most of the body's potassium is inside of cells, and only a very small amount is in the blood. Because the amount in the blood is so small, minor changes can be life-threatening. CAUSES  Antibiotics.  Diarrhea or vomiting.  Using laxatives too much, which can cause diarrhea.  Chronic kidney disease.  Water pills (diuretics).  Eating disorders (bulimia).  Low magnesium level.  Sweating a lot. SIGNS AND SYMPTOMS  Weakness.  Constipation.  Fatigue.  Muscle cramps.  Mental confusion.  Skipped heartbeats or irregular heartbeat (palpitations).  Tingling or numbness. DIAGNOSIS  Your health care provider can diagnose hypokalemia with blood tests. In addition to checking your potassium level, your health care provider may also check other lab tests. TREATMENT Hypokalemia can be treated with potassium supplements taken by mouth or adjustments in your current medicines. If your potassium level is very low, you may need to get potassium through a vein (IV) and be monitored in the hospital. A diet high in potassium is also helpful. Foods high in potassium are:  Nuts, such as peanuts and pistachios.  Seeds, such as sunflower seeds and pumpkin seeds.  Peas, lentils, and lima beans.  Whole grain and bran cereals and breads.  Fresh fruit and vegetables, such as apricots, avocado, bananas, cantaloupe, kiwi, oranges, tomatoes, asparagus, and potatoes.  Orange and tomato juices.  Red meats.  Fruit yogurt. HOME CARE INSTRUCTIONS  Take all medicines as prescribed by your health care provider.  Maintain a healthy diet by including nutritious food, such as fruits, vegetables, nuts, whole grains, and lean meats.  If  you are taking a laxative, be sure to follow the directions on the label. SEEK MEDICAL CARE IF:  Your weakness gets worse.  You feel your heart pounding or racing.  You are vomiting or having diarrhea.  You are diabetic and having trouble keeping your blood glucose in the normal range. SEEK IMMEDIATE MEDICAL CARE IF:  You have chest pain, shortness of breath, or dizziness.  You are vomiting or having diarrhea for more than 2 days.  You faint. MAKE SURE YOU:   Understand these instructions.  Will watch your condition.  Will get help right away if you are not doing well or get worse.   This information is not intended to replace advice given to you by your health care provider. Make sure you discuss any questions you have with your health care provider.   Document Released: 12/24/2004 Document Revised: 01/14/2014 Document Reviewed: 06/26/2012 Elsevier Interactive Patient Education 2016 Elsevier Inc.  Nausea and Vomiting Nausea means you feel sick to your stomach. Throwing up (vomiting) is a reflex where stomach contents come out of your mouth. HOME CARE   Take medicine as told by your doctor.  Do not force yourself to eat. However, you do need to drink fluids.  If you feel like eating, eat a normal diet as told by your doctor.  Eat rice, wheat, potatoes, bread, lean meats, yogurt, fruits, and vegetables.  Avoid high-fat foods.  Drink enough fluids to keep your pee (urine) clear or pale yellow.  Ask your doctor how to replace body fluid losses (rehydrate). Signs of body fluid loss (dehydration) include:  Feeling very thirsty.  Dry lips and mouth.  Feeling dizzy.  Dark pee.  Peeing less than normal.  Feeling confused.  Fast breathing or heart rate. GET HELP RIGHT AWAY IF:   You have blood in your throw up.  You have black or bloody poop (stool).  You have a bad headache or stiff neck.  You feel confused.  You have bad belly (abdominal) pain.  You  have chest pain or trouble breathing.  You do not pee at least once every 8 hours.  You have cold, clammy skin.  You keep throwing up after 24 to 48 hours.  You have a fever. MAKE SURE YOU:   Understand these instructions.  Will watch your condition.  Will get help right away if you are not doing well or get worse.   This information is not intended to replace advice given to you by your health care provider. Make sure you discuss any questions you have with your health care provider.   Document Released: 06/12/2007 Document Revised: 03/18/2011 Document Reviewed: 05/25/2010 Elsevier Interactive Patient Education [employer]

## 2015-05-14 NOTE — ED Provider Notes (Signed)
CSN: [id number]     Arrival date & time 05/13/15  2114 History   First MD Initiated Contact with Patient 05/13/15 2145     Chief Complaint  Patient presents with  . Nausea  . Emesis  . Abdominal Pain     (Consider location/radiation/quality/duration/timing/severity/associated sxs/prior Treatment) HPI   25 year old male with abdominal pain. Generalized. Associated nausea and vomiting. Recurrent. Starting again yesterday. Persistent since then. Burning in the periumbilical region and epigastrium. No fevers or chills. No urinary complaints. No diarrhea. No sick contacts. Thinks it may be an ulcer. He also reports smoking marijuana on a daily basis.  Past Medical History  Diagnosis Date  . HIV disease (HCC)   . Abdominal pain    History reviewed. No pertinent past surgical history. No family history on file. Social History  Substance Use Topics  . Smoking status: Current Every Day Smoker  . Smokeless tobacco: None  . Alcohol Use: No    Review of Systems  All systems reviewed and negative, other than as noted in HPI.   Allergies  Review of patient's allergies indicates no known allergies.  Home Medications   Prior to Admission medications   Medication Sig Start Date End Date Taking? Authorizing Provider  abacavir-dolutegravir-lamiVUDine (TRIUMEQ) 600-50-300 MG tablet Take 1 tablet by mouth daily. 03/06/15  Yes Judyann Munsonynthia Snider, MD  ondansetron (ZOFRAN) 4 MG tablet Take 1 tablet (4 mg total) by mouth every 6 (six) hours. 01/21/15  Yes Stevi Barrett, PA-C  pantoprazole (PROTONIX) 20 MG tablet Take 1 tablet (20 mg total) by mouth daily. 01/21/15  Yes Stevi Barrett, PA-C  dicyclomine (BENTYL) 20 MG tablet Take 1 tablet (20 mg total) by mouth 2 (two) times daily. Patient not taking: Reported on 05/14/2015 01/20/15   Melton KrebsSamantha Nicole Riley, PA-C  famotidine (PEPCID) 20 MG tablet Take 1 tablet (20 mg total) by mouth 2 (two) times daily. Patient not taking: Reported on 05/14/2015 01/21/15    Rolm GalaStevi Barrett, PA-C  HYDROcodone-acetaminophen (NORCO/VICODIN) 5-325 MG tablet Take 1 tablet by mouth every 6 (six) hours as needed. Patient not taking: Reported on 05/14/2015 01/21/15   Stevi Barrett, PA-C   BP 124/93 mmHg  Pulse 56  Temp(Src) 97.4 F (36.3 C) (Oral)  Resp 16  Ht 5\' 4"  (1.626 m)  Wt 110 lb (49.896 kg)  BMI 18.87 kg/m2  SpO2 100% Physical Exam  Constitutional: He appears well-developed and well-nourished. No distress.  HENT:  Head: Normocephalic and atraumatic.  Eyes: Conjunctivae are normal. Right eye exhibits no discharge. Left eye exhibits no discharge.  Neck: Neck supple.  Cardiovascular: Normal rate, regular rhythm and normal heart sounds.  Exam reveals no gallop and no friction rub.   No murmur heard. Pulmonary/Chest: Effort normal and breath sounds normal. No respiratory distress.  Abdominal: Soft. He exhibits no distension. There is tenderness.  Mild diffuse tenderness  Musculoskeletal: He exhibits no edema or tenderness.  Neurological: He is alert.  Skin: Skin is warm and dry.  Psychiatric: He has a normal mood and affect. His behavior is normal. Thought content normal.  Nursing note and vitals reviewed.   ED Course  Procedures (including critical care time) Labs Review Labs Reviewed  COMPREHENSIVE METABOLIC PANEL - Abnormal; Notable for the following:    Sodium 133 (*)    Potassium 2.9 (*)    Chloride 99 (*)    CO2 18 (*)    Glucose, Bld 130 (*)    ALT 16 (*)    Anion gap 16 (*)  All other components within normal limits  CBC - Abnormal; Notable for the following:    WBC 15.0 (*)    All other components within normal limits  LIPASE, BLOOD  URINALYSIS, ROUTINE W REFLEX MICROSCOPIC (NOT AT [facility])    Imaging Review No results found. I have personally reviewed and evaluated these images and lab results as part of my medical decision-making.   EKG Interpretation None      MDM   Final diagnoses:  Non-intractable vomiting with nausea,  vomiting of unspecified type    25yM with n/v. Now significantly better. Smokes "weed all day every day." May be cannabinoid hyperemesis syndrome. Does report improvement with hot showers. Regardless. Abdominal exam is now benign. I have a low suspicion for emergent process.     Raeford Razor, MD 05/20/15 1200

## 2015-05-14 NOTE — ED Notes (Signed)
Patient was asked for urine sample and he states that he went to the bathroom but did not leave a sample.

## 2015-05-15 ENCOUNTER — Ambulatory Visit: Payer: Self-pay | Admitting: *Deleted

## 2015-05-15 ENCOUNTER — Other Ambulatory Visit: Payer: Self-pay | Admitting: *Deleted

## 2015-05-15 VITALS — BP 136/88 | HR 110 | Temp 98.5°F

## 2015-05-15 DIAGNOSIS — R1013 Epigastric pain: Secondary | ICD-10-CM

## 2015-05-15 DIAGNOSIS — F411 Generalized anxiety disorder: Secondary | ICD-10-CM

## 2015-05-15 MED ORDER — FAMOTIDINE 20 MG PO TABS: 20.0000 mg | ORAL_TABLET | Freq: Two times a day (BID) | ORAL | Status: DC

## 2015-05-15 MED ORDER — OMEPRAZOLE 20 MG PO CPDR: 20.0000 mg | DELAYED_RELEASE_CAPSULE | Freq: Two times a day (BID) | ORAL | Status: DC

## 2015-05-15 NOTE — Progress Notes (Signed)
Patient walk in to clinic distressed, asking for help. This RN and Tomasita Morrowammy King, RN brought patient back for assessment.  Patient in 10/10 pain reporting abdominal pain, stating that burning and bloating pressure so intense it causes intractable vomiting.  Patient states he has used protonix 30 mg in past that would resolve the pain.  He states he has been diagnosed with an ulcer in the past, has been without any medication for this "for many months."  Patient reports no relief from nausea medication.   Patient needs to restart ADAP-formulary treatment for ulcer medication per Dr. Daiva EvesVan Dam.  Orders sent per verbal order by Dr. Daiva EvesVan Dam. Patient also reports thoughts of "putting a bullet through my head, the pain is so bad." Patient to meet with Jenel LucksJodi Herring for suicidal ideation, to contract for safety. Andree CossHowell, Mykeal Carrick M, RN

## 2015-05-15 NOTE — BH Specialist Note (Signed)
Counselor was prompted by Dr. Daiva EvesVan Dam to immediately meet with Carl Barber.  Patient was sobbing and slumped over in a wheel chair when counselor entered the exam room where the encounter took place.  Patient was communicating loudly and speaking fast. Patient shared that he was done with not feeling well and was so tired of feeling his stomach pain that he wanted to die.  Patient stated that he does not really want to hurt himself but that he has had enough of not feeling well on a daily basis.  Counselor implemented a breathing/relaxation exercise with patient in order to deescalate his high emotions.  Patient responded very well and became more clam and slower speech.  Counselor discussed with patient that his stinking thinking was not helping his situation if anything it was making it worse and unbearable.  Counselor had patient agree to a Engineer, manufacturing systemssafety contract that he would not hurt himself and would make an appointment with counselor.  Patient made an appointment in two more days. Counselor provided support and encouragement accordingly.   Carl LucksJodi Rashelle Ireland, MA, LPC [facility]

## 2015-05-17 ENCOUNTER — Ambulatory Visit: Payer: Self-pay | Admitting: *Deleted

## 2015-05-22 ENCOUNTER — Telehealth: Payer: Self-pay | Admitting: *Deleted

## 2015-05-22 NOTE — Telephone Encounter (Signed)
Attempted to contact patient 5/9 and 5/15 for follow up, to see how his new medications are working and if his pain is better controlled.  No answer on 5/9 (phone disconnected) and 5/15 (no voicemail set up). Will try again. Andree CossHowell, Ermina Oberman M, RN

## 2015-05-23 ENCOUNTER — Encounter (HOSPITAL_COMMUNITY): Payer: Self-pay

## 2015-05-23 ENCOUNTER — Emergency Department (HOSPITAL_COMMUNITY)
Admission: EM | Admit: 2015-05-23 | Discharge: 2015-05-23 | Disposition: A | Discharge status: Home / Self Care | Payer: Self-pay | Attending: Emergency Medicine | Admitting: Emergency Medicine

## 2015-05-23 ENCOUNTER — Telehealth: Payer: Self-pay

## 2015-05-23 ENCOUNTER — Emergency Department (HOSPITAL_COMMUNITY): Payer: Self-pay

## 2015-05-23 DIAGNOSIS — Z79899 Other long term (current) drug therapy: Secondary | ICD-10-CM | POA: Insufficient documentation

## 2015-05-23 DIAGNOSIS — F17200 Nicotine dependence, unspecified, uncomplicated: Secondary | ICD-10-CM | POA: Insufficient documentation

## 2015-05-23 DIAGNOSIS — Z79891 Long term (current) use of opiate analgesic: Secondary | ICD-10-CM | POA: Insufficient documentation

## 2015-05-23 DIAGNOSIS — R1013 Epigastric pain: Secondary | ICD-10-CM | POA: Insufficient documentation

## 2015-05-23 DIAGNOSIS — Z21 Asymptomatic human immunodeficiency virus [HIV] infection status: Secondary | ICD-10-CM | POA: Insufficient documentation

## 2015-05-23 LAB — COMPREHENSIVE METABOLIC PANEL
ALBUMIN: 5.1 g/dL — AB (ref 3.5–5.0)
ALK PHOS: 39 U/L (ref 38–126)
ALT: 21 U/L (ref 17–63)
AST: 29 U/L (ref 15–41)
Anion gap: 9 (ref 5–15)
BUN: 12 mg/dL (ref 6–20)
CO2: 23 mmol/L (ref 22–32)
Calcium: 9.7 mg/dL (ref 8.9–10.3)
Chloride: 106 mmol/L (ref 101–111)
Creatinine, Ser: 0.79 mg/dL (ref 0.61–1.24)
GFR calc Af Amer: >60 mL/min (ref >60)
GFR calc non Af Amer: >60 mL/min (ref >60)
Glucose, Bld: 119 mg/dL — ABNORMAL HIGH (ref 65–99)
Potassium: 3.6 mmol/L (ref 3.5–5.1)
SODIUM: 138 mmol/L (ref 135–145)
TOTAL PROTEIN: 7.9 g/dL (ref 6.5–8.1)
Total Bilirubin: 0.6 mg/dL (ref 0.3–1.2)

## 2015-05-23 LAB — URINALYSIS, ROUTINE W REFLEX MICROSCOPIC
Bilirubin Urine: NEGATIVE
Glucose, UA: NEGATIVE mg/dL
Hgb urine dipstick: NEGATIVE
KETONES UR: NEGATIVE mg/dL
LEUKOCYTES UA: NEGATIVE
NITRITE: NEGATIVE
PH: 8.5 — AB (ref 5.0–8.0)
Protein, ur: NEGATIVE mg/dL
SPECIFIC GRAVITY, URINE: 1.02 (ref 1.005–1.030)

## 2015-05-23 LAB — CBC
HEMATOCRIT: 44.8 % (ref 39.0–52.0)
HEMOGLOBIN: 15.3 g/dL (ref 13.0–17.0)
MCH: 29.1 pg (ref 26.0–34.0)
MCHC: 34.2 g/dL (ref 30.0–36.0)
MCV: 85.2 fL (ref 78.0–100.0)
Platelets: 257 10*3/uL (ref 150–400)
RBC: 5.26 MIL/uL (ref 4.22–5.81)
RDW: 14.3 % (ref 11.5–15.5)
WBC: 14.1 10*3/uL — AB (ref 4.0–10.5)

## 2015-05-23 LAB — URINE MICROSCOPIC-ADD ON
BACTERIA UA: NONE SEEN
RBC / HPF: NONE SEEN RBC/hpf (ref 0–5)
SQUAMOUS EPITHELIAL / LPF: NONE SEEN
WBC UA: NONE SEEN WBC/hpf (ref 0–5)

## 2015-05-23 LAB — LIPASE, BLOOD: Lipase: 25 U/L (ref 11–51)

## 2015-05-23 MED ORDER — ONDANSETRON HCL 4 MG/2ML IJ SOLN
4.0000 mg | Freq: Once | INTRAMUSCULAR | Status: AC
  Administered 2015-05-23: 4 mg via INTRAVENOUS
  Filled 2015-05-23: qty 2

## 2015-05-23 MED ORDER — HALOPERIDOL LACTATE 5 MG/ML IJ SOLN
3.0000 mg | Freq: Once | INTRAMUSCULAR | Status: AC
  Administered 2015-05-23: 3 mg via INTRAVENOUS
  Filled 2015-05-23: qty 1

## 2015-05-23 MED ORDER — FAMOTIDINE IN NACL 20-0.9 MG/50ML-% IV SOLN
20.0000 mg | Freq: Once | INTRAVENOUS | Status: AC
  Administered 2015-05-23: 20 mg via INTRAVENOUS
  Filled 2015-05-23: qty 50

## 2015-05-23 NOTE — ED Notes (Signed)
PT DISCHARGED. INSTRUCTIONS GIVEN. AAOX3. PT IN NO APPARENT DISTRESS OR PAIN. THE OPPORTUNITY TO ASK QUESTIONS WAS PROVIDED. 

## 2015-05-23 NOTE — ED Notes (Signed)
Pt presents with c/o vomiting and abdominal pain that started today. Pt denies any diarrhea. Pt is actively vomiting in triage.

## 2015-05-23 NOTE — Progress Notes (Signed)
Entered in d/c instructions Please use the resources provided to you in emergency room by case manager to assist you're your choice of doctor for follow up A referral for you has been sent to [facility] if you have not received a call in 3 days you may contact them Call Scherry RanKaren Andrianos at [phone] Tuesday-Friday [email or web address] + referral to [facility] These [place] uninsured resources provide possible primary care providers, resources for discounted medications, housing, dental resources, affordable care act information, plus other resources for [place]

## 2015-05-23 NOTE — Progress Notes (Signed)
ED CM noted with Chart review pt without pcp but [facility] CM in 02/2015 and offered resources [facility] noted to be trying to make contact with pt without success  Related pt listed number Pt speaks and understands english Pt verified his number to be correct as listed in EPIC [phone]  Pt confirms with ED CM he has not obtained a uninsured pcp CM dialed pt number to confirm his cell did ring beside him in LW RM #19 CM left pt again with a list of [place] uninsured resources in spanish per pt request  CM spoke with pt who [place].  CM discussed and provided written information to assist pt with determining choice for uninsured accepting pcps, discussed the importance of pcp vs EDP services for f/u care, www.needymeds.org, www.goodrx.com, discounted pharmacies and other [place] resources such as [facility] , [facility], affordable care act, financial assistance, uninsured dental services, [place] med assist, DSS and  health department  Reviewed resources for [place] uninsured accepting pcps like [facility], family medicine at [address], [facility], [facility], local urgent care centers, [facility], [facility], general medical clinics, [facility], [facility] plus others, medication resources, [facility] out patient pharmacies and housing Pt voiced understanding and appreciation of resources provided   Provided [facility] contact information Pt agreed to a referral to [facility] and [facility] Cm completed referrals Pt to be contact by [facility] clinical liason

## 2015-05-23 NOTE — ED Notes (Signed)
Was unable to obtain a blood pressure due to patient vomiting.

## 2015-05-23 NOTE — Telephone Encounter (Signed)
Message received from Marval RegalKim Gibbs, RN CM requesting a hospital follow up appointment for the [facility]; but there are not any appointments available at this time. The patient can call the [facility] at a later date to inquire about appointment availability.  Contact information for the [facility] was placed on the AVS.   Update provided to K. Noelle PennerGibbs, RN CM

## 2015-05-23 NOTE — Progress Notes (Signed)
Spoke with Erskine SquibbJane of [facility] No available appts at this time  Pt [facility]

## 2015-05-23 NOTE — Discharge Instructions (Signed)

## 2015-05-23 NOTE — ED Provider Notes (Signed)
CSN: [id number]     Arrival date & time 05/23/15  1103 History   First MD Initiated Contact with Patient 05/23/15 1133     Chief Complaint  Patient presents with  . Abdominal Pain  . Emesis   HPI   25 year old male presents today with abdominal pain. Patient notes significant past medical history of gastric ulcers, HIV, chronic abdominal pain. Patient most recently seen on 05/13/2015 for similar presentation. Patient notes symptoms resolved at ED visit, and has had very minimal pain since then. Patient reports that this morning he started experiencing epigastric abdominal pain, similar to previous, described as sharp. Patient reports associated nausea. Patient denies vomiting, lower abdominal pain, diarrhea or constipation, fever, or any drugs or alcohol.    Past Medical History  Diagnosis Date  . HIV disease (HCC)   . Abdominal pain    History reviewed. No pertinent past surgical history. No family history on file. Social History  Substance Use Topics  . Smoking status: Current Every Day Smoker  . Smokeless tobacco: None  . Alcohol Use: No    Review of Systems  All other systems reviewed and are negative.   Allergies  Review of patient's allergies indicates no known allergies.  Home Medications   Prior to Admission medications   Medication Sig Start Date End Date Taking? Authorizing Provider  abacavir-dolutegravir-lamiVUDine (TRIUMEQ) 600-50-300 MG tablet Take 1 tablet by mouth daily. 03/06/15  Yes Judyann Munson, MD  famotidine (PEPCID) 20 MG tablet Take 1 tablet (20 mg total) by mouth 2 (two) times daily. 05/15/15  Yes Randall Hiss, MD  omeprazole (PRILOSEC) 20 MG capsule Take 1 capsule (20 mg total) by mouth 2 (two) times daily before a meal. 05/15/15  Yes Randall Hiss, MD  ondansetron (ZOFRAN) 4 MG tablet Take 1 tablet (4 mg total) by mouth every 6 (six) hours. 01/21/15  Yes Stevi Barrett, PA-C  pantoprazole (PROTONIX) 20 MG tablet Take 1 tablet (20 mg total) by  mouth daily. 01/21/15  Yes Stevi Barrett, PA-C  dicyclomine (BENTYL) 20 MG tablet Take 1 tablet (20 mg total) by mouth 2 (two) times daily. Patient not taking: Reported on 05/14/2015 01/20/15   Melton Krebs, PA-C  HYDROcodone-acetaminophen (NORCO/VICODIN) 5-325 MG tablet Take 1 tablet by mouth every 6 (six) hours as needed. Patient not taking: Reported on 05/14/2015 01/21/15   Stevi Barrett, PA-C   BP 124/73 mmHg  Pulse 74  Temp(Src) 97.9 F (36.6 C) (Oral)  Resp 18  SpO2 100%   Physical Exam  Constitutional: He is oriented to person, place, and time. He appears well-developed and well-nourished.  HENT:  Head: Normocephalic and atraumatic.  Eyes: Conjunctivae are normal. Pupils are equal, round, and reactive to light. Right eye exhibits no discharge. Left eye exhibits no discharge. No scleral icterus.  Neck: Normal range of motion. No JVD present. No tracheal deviation present.  Pulmonary/Chest: Effort normal. No stridor.  Abdominal: Soft. He exhibits no distension and no mass. There is tenderness. There is no rebound and no guarding.  Epigastric tenderness to palpation  Neurological: He is alert and oriented to person, place, and time. Coordination normal.  Psychiatric: He has a normal mood and affect. His behavior is normal. Judgment and thought content normal.  Nursing note and vitals reviewed.   ED Course  Procedures (including critical care time) Labs Review Labs Reviewed  COMPREHENSIVE METABOLIC PANEL - Abnormal; Notable for the following:    Glucose, Bld 119 (*)    Albumin 5.1 (*)  All other components within normal limits  CBC - Abnormal; Notable for the following:    WBC 14.1 (*)    All other components within normal limits  URINALYSIS, ROUTINE W REFLEX MICROSCOPIC (NOT AT [facility]) - Abnormal; Notable for the following:    APPearance TURBID (*)    pH 8.5 (*)    All other components within normal limits  LIPASE, BLOOD  URINE MICROSCOPIC-ADD ON    Imaging  Review Dg Abd Acute W/chest  05/23/2015  CLINICAL DATA:  Epigastric region pain EXAM: DG ABDOMEN ACUTE W/ 1V CHEST COMPARISON:  CT abdomen and pelvis January 16, 2015; chest radiograph January 16, 2015 FINDINGS: PA chest: Lungs are clear. Heart size and pulmonary vascularity are normal. No adenopathy. Supine and upright abdomen: There is a paucity of gas overall. There is moderate stool in the colon. No bowel dilatation or air-fluid level suggesting obstruction. No free air. No abnormal calcifications. IMPRESSION: Paucity of gas. While this finding may be seen normally, it also may be indicative of enteritis or early ileus. Obstruction not felt to be likely. No free air. Lungs clear. Electronically Signed   By: Bretta BangWilliam  Woodruff III M.D.   On: 05/23/2015 12:17   I have personally reviewed and evaluated these images and lab results as part of my medical decision-making.   EKG Interpretation None      MDM   Final diagnoses:  Epigastric pain    Labs:Urinalysis, lipase, CMP, CBC  Imaging: DG abdomen acute with chest  Consults:  Therapeutics: Haldol, Zofran, Pepcid  Discharge Meds:   Assessment/Plan: 25 year old male presents today with complaints of abdominal pain. Patient with a history of chronic abdominal pain. Patient had tenderness to palpation of his abdomen, reports this is identical to previous episodes. I informed him that I be getting plain films of his abdomen, he was upset that I was doing studies, I informed him that this was indicated at the time, he reports that plain films were okay but would not want a CT scan. Patient was given medications identical to prior as this provided symptomatic improvement his last visit.  Laboratory analysis reassuring, repeat evaluation of the patient shows him sleeping comfortably in exam bed.  Repeat evaluation shows patient resting comfortably, he was awaken and reports symptomatic improvement in symptoms.  Repeat abdominal exam shows no  significant tenderness to palpation, patient tolerating by mouth, he'll be discharged home. Patient was given follow-up information, instructed in precautions, he verbalized understanding and agreement today's plan.        Eyvonne MechanicJeffrey Advit Trethewey, PA-C 05/23/15 1634  Marily MemosJason Mesner, MD 05/23/15 [phone]

## 2015-05-25 ENCOUNTER — Emergency Department (HOSPITAL_COMMUNITY)
Admission: EM | Admit: 2015-05-25 | Discharge: 2015-05-25 | Discharge status: Against Medical Advice | Payer: Self-pay | Attending: Dermatology | Admitting: Dermatology

## 2015-05-25 ENCOUNTER — Encounter (HOSPITAL_COMMUNITY): Payer: Self-pay | Admitting: *Deleted

## 2015-05-25 ENCOUNTER — Telehealth: Payer: Self-pay | Admitting: *Deleted

## 2015-05-25 DIAGNOSIS — R109 Unspecified abdominal pain: Secondary | ICD-10-CM | POA: Insufficient documentation

## 2015-05-25 DIAGNOSIS — F17200 Nicotine dependence, unspecified, uncomplicated: Secondary | ICD-10-CM | POA: Insufficient documentation

## 2015-05-25 DIAGNOSIS — R112 Nausea with vomiting, unspecified: Secondary | ICD-10-CM | POA: Insufficient documentation

## 2015-05-25 NOTE — ED Notes (Signed)
Per EMS, pt reports abd pain with nv.  [facility].  Pt hyperventilating and crying in triage.  Instructed to slow his breathing down, but continues to hyperventilate.  Pt in NAD.  Pt ambulatory

## 2015-05-25 NOTE — Telephone Encounter (Signed)
Patient walked into clinic and collapsed at front desk. He was assisted to exam room by myself and Alesia Morinravis Poole, CMA. He is c/o severe stomach pain on a level of 10. He is sobbing and stated he has been to the ED and no one will help him. He stated, "they tell me, I need to see my doctor". Due to patient's severe pain an ambulance was called and [facility] (patient requested this hospital). His B/P was 126/82, pulse 99, oxygen stats 100% and temperature 98.2.

## 2015-05-25 NOTE — ED Notes (Signed)
Pt walked up to nurses' station.  Pt upset and cursing, because he had been waiting for .  Pt informed that there were a couple people ahead of him, but we would get him back asap.  Pt stated "f*ck this" and stormed out of the department.

## 2015-06-08 ENCOUNTER — Ambulatory Visit: Payer: Self-pay | Admitting: Infectious Diseases

## 2015-06-14 ENCOUNTER — Ambulatory Visit (INDEPENDENT_AMBULATORY_CARE_PROVIDER_SITE_OTHER): Payer: Self-pay | Admitting: Infectious Diseases

## 2015-06-14 ENCOUNTER — Ambulatory Visit: Payer: Self-pay | Admitting: *Deleted

## 2015-06-14 ENCOUNTER — Encounter: Payer: Self-pay | Admitting: Infectious Diseases

## 2015-06-14 VITALS — BP 110/73 | HR 102 | Temp 98.2°F | Wt 111.0 lb

## 2015-06-14 DIAGNOSIS — B20 Human immunodeficiency virus [HIV] disease: Secondary | ICD-10-CM

## 2015-06-14 DIAGNOSIS — Z79899 Other long term (current) drug therapy: Secondary | ICD-10-CM

## 2015-06-14 DIAGNOSIS — R1084 Generalized abdominal pain: Secondary | ICD-10-CM

## 2015-06-14 LAB — CBC
HEMATOCRIT: 43.3 % (ref 38.5–50.0)
HEMOGLOBIN: 14.2 g/dL (ref 13.2–17.1)
MCH: 28.6 pg (ref 27.0–33.0)
MCHC: 32.8 g/dL (ref 32.0–36.0)
MCV: 87.1 fL (ref 80.0–100.0)
MPV: 9.6 fL (ref 7.5–12.5)
PLATELETS: 255 10*3/uL (ref 140–400)
RBC: 4.97 MIL/uL (ref 4.20–5.80)
RDW: 15.3 % — ABNORMAL HIGH (ref 11.0–15.0)
WBC: 6 10*3/uL (ref 3.8–10.8)

## 2015-06-14 NOTE — Assessment & Plan Note (Addendum)
Does not appear that he is taking his ART based on his labs.  Will change him to descovy/dtgv if his labs show that he is detectable. Will check his labs today.

## 2015-06-14 NOTE — Assessment & Plan Note (Signed)
Will try to have him seen by GI Will check amylase and lipase

## 2015-06-14 NOTE — BH Specialist Note (Signed)
Counselor met with Carl Barber today in the exam room for a warm handoff.  Patient was oriented times four with good affect and dress.  Patient was alert and talkative. Patient shared that he was beyond frustration at the lack of help he has gotten from the medical community here in [place]. Patient stated that he had bought a ticket to [place] and was headed there to get the necessary treatment that he felt he deserved.  Patient was angry and not in good spirits.  Patient blamed people for not believing him and not taking him seriously when it came to his abdominal pain.  Counselor used reflective listening skills while patient shared.  Counselor also provided support and encouragement accordingly for patient while he vented.   Rolena Infante, MA, LPC [facility]

## 2015-06-14 NOTE — Progress Notes (Signed)
   Subjective:    Patient ID: Carl Barber, male    DOB: 21-Apr-1990, 25 y.o.   MRN: [id number]  HPI 25 yo M with hx of HIV+ since 2011. Has prev been on triumeq.  Has had multiple ED visits, for abd pain. Some question that he has n/v due to canibas use. Has not gotten relief from pepcid. Feels like pain is severe to that point he cannot breath. [facility] and [facility] as to what causes his pain. Cannot describe what makes pain better.  Feels like pain starts in AM after using bathroom. Every AM. He then has pain for several hours. Improved with eating, drinking milk.  Decreased apetite, eats once a day.  Was seen in ED 5-16 and had plain film of abd Paucity of gas. While this finding may be seen normally, it also may be indicative of enteritis or early ileus. Obstruction not felt to be likely. No free air. Lungs clear. His WBC was 14.1.  Today states he is feeling well.  Wants to go to [place] for GI eval.   HIV 1 RNA QUANT (copies/mL)  Date Value  01/25/2015 63355*   CD4 T CELL ABS (/uL)  Date Value  01/25/2015 320*     Review of Systems  Constitutional: Negative for unexpected weight change.  Gastrointestinal: Negative for diarrhea, constipation and blood in stool.  worried that his ART is causing mood changes.      Objective:   Physical Exam  Constitutional: He appears well-developed and well-nourished.  HENT:  Mouth/Throat: No oropharyngeal exudate.  Eyes: EOM are normal. Pupils are equal, round, and reactive to light.  Neck: Neck supple.  Cardiovascular: Normal rate, regular rhythm and normal heart sounds.   Pulmonary/Chest: Effort normal and breath sounds normal.  Abdominal: Soft. Bowel sounds are normal. He exhibits no distension and no mass. There is no tenderness. There is no rebound and no guarding.  Musculoskeletal: He exhibits no edema.  Lymphadenopathy:    He has no cervical adenopathy.       Assessment & Plan:

## 2015-06-15 LAB — COMPREHENSIVE METABOLIC PANEL
ALBUMIN: 4.4 g/dL (ref 3.6–5.1)
ALK PHOS: 47 U/L (ref 40–115)
ALT: 14 U/L (ref 9–46)
AST: 26 U/L (ref 10–40)
BUN: 12 mg/dL (ref 7–25)
CALCIUM: 9.4 mg/dL (ref 8.6–10.3)
CO2: 24 mmol/L (ref 20–31)
Chloride: 103 mmol/L (ref 98–110)
Creat: 1.03 mg/dL (ref 0.60–1.35)
Glucose, Bld: 100 mg/dL — ABNORMAL HIGH (ref 65–99)
POTASSIUM: 4.5 mmol/L (ref 3.5–5.3)
Sodium: 141 mmol/L (ref 135–146)
TOTAL PROTEIN: 6.6 g/dL (ref 6.1–8.1)
Total Bilirubin: 0.3 mg/dL (ref 0.2–1.2)

## 2015-06-15 LAB — LIPID PANEL
CHOL/HDL RATIO: 3.6 ratio (ref <=5.0)
CHOLESTEROL: 162 mg/dL (ref 125–200)
HDL: 45 mg/dL (ref >=40)
LDL Cholesterol: 97 mg/dL (ref <130)
TRIGLYCERIDES: 98 mg/dL (ref <150)
VLDL: 20 mg/dL (ref <30)

## 2015-06-15 LAB — AMYLASE: Amylase: 57 U/L (ref 0–105)

## 2015-06-15 LAB — LIPASE: LIPASE: 27 U/L (ref 7–60)

## 2015-06-15 LAB — T-HELPER CELL (CD4) - (RCID CLINIC ONLY)
CD4 T CELL HELPER: 21 % — AB (ref 33–55)
CD4 T Cell Abs: 430 /uL (ref 400–2700)

## 2015-06-16 LAB — HIV-1 RNA QUANT-NO REFLEX-BLD
HIV 1 RNA Quant: <20 copies/mL (ref <20)
HIV-1 RNA Quant, Log: <1.3 Log copies/mL (ref <1.30)

## 2015-06-19 ENCOUNTER — Telehealth: Payer: Self-pay | Admitting: Infectious Diseases

## 2015-06-19 NOTE — Telephone Encounter (Signed)
error 

## 2015-07-13 ENCOUNTER — Ambulatory Visit: Payer: Self-pay

## 2015-07-30 ENCOUNTER — Encounter (HOSPITAL_COMMUNITY): Payer: Self-pay | Admitting: Emergency Medicine

## 2015-07-30 ENCOUNTER — Emergency Department (HOSPITAL_COMMUNITY)
Admission: EM | Admit: 2015-07-30 | Discharge: 2015-07-30 | Disposition: A | Discharge status: Home / Self Care | Payer: Self-pay | Attending: Emergency Medicine | Admitting: Emergency Medicine

## 2015-07-30 DIAGNOSIS — R1013 Epigastric pain: Secondary | ICD-10-CM | POA: Insufficient documentation

## 2015-07-30 DIAGNOSIS — F17210 Nicotine dependence, cigarettes, uncomplicated: Secondary | ICD-10-CM | POA: Insufficient documentation

## 2015-07-30 LAB — CBC
HCT: 46.6 % (ref 39.0–52.0)
Hemoglobin: 15.3 g/dL (ref 13.0–17.0)
MCH: 28.6 pg (ref 26.0–34.0)
MCHC: 32.8 g/dL (ref 30.0–36.0)
MCV: 87.1 fL (ref 78.0–100.0)
PLATELETS: 232 10*3/uL (ref 150–400)
RBC: 5.35 MIL/uL (ref 4.22–5.81)
RDW: 14.3 % (ref 11.5–15.5)
WBC: 10.2 10*3/uL (ref 4.0–10.5)

## 2015-07-30 LAB — COMPREHENSIVE METABOLIC PANEL
ALBUMIN: 4.6 g/dL (ref 3.5–5.0)
ALK PHOS: 46 U/L (ref 38–126)
ALT: 14 U/L — AB (ref 17–63)
ANION GAP: 11 (ref 5–15)
AST: 25 U/L (ref 15–41)
BILIRUBIN TOTAL: 0.3 mg/dL (ref 0.3–1.2)
BUN: 8 mg/dL (ref 6–20)
CALCIUM: 10.2 mg/dL (ref 8.9–10.3)
CO2: 22 mmol/L (ref 22–32)
CREATININE: 0.95 mg/dL (ref 0.61–1.24)
Chloride: 105 mmol/L (ref 101–111)
GFR calc Af Amer: >60 mL/min (ref >60)
GFR calc non Af Amer: >60 mL/min (ref >60)
GLUCOSE: 127 mg/dL — AB (ref 65–99)
Potassium: 4.2 mmol/L (ref 3.5–5.1)
Sodium: 138 mmol/L (ref 135–145)
TOTAL PROTEIN: 7.7 g/dL (ref 6.5–8.1)

## 2015-07-30 LAB — LIPASE, BLOOD: Lipase: 20 U/L (ref 11–51)

## 2015-07-30 LAB — CBG MONITORING, ED: GLUCOSE-CAPILLARY: 121 mg/dL — AB (ref 65–99)

## 2015-07-30 MED ORDER — METOCLOPRAMIDE HCL 5 MG/ML IJ SOLN
10.0000 mg | Freq: Once | INTRAMUSCULAR | Status: AC
  Administered 2015-07-30: 10 mg via INTRAVENOUS
  Filled 2015-07-30: qty 2

## 2015-07-30 MED ORDER — FAMOTIDINE IN NACL 20-0.9 MG/50ML-% IV SOLN
20.0000 mg | Freq: Two times a day (BID) | INTRAVENOUS | Status: DC
  Administered 2015-07-30: 20 mg via INTRAVENOUS
  Filled 2015-07-30 (×2): qty 50

## 2015-07-30 MED ORDER — DIPHENHYDRAMINE HCL 50 MG/ML IJ SOLN
25.0000 mg | Freq: Once | INTRAMUSCULAR | Status: AC
  Administered 2015-07-30: 25 mg via INTRAVENOUS
  Filled 2015-07-30: qty 1

## 2015-07-30 MED ORDER — SODIUM CHLORIDE 0.9 % IV BOLUS (SEPSIS)
1000.0000 mL | Freq: Once | INTRAVENOUS | Status: AC
  Administered 2015-07-30: 1000 mL via INTRAVENOUS

## 2015-07-30 MED ORDER — MORPHINE SULFATE (PF) 4 MG/ML IV SOLN: 4.0000 mg | Freq: Once | INTRAVENOUS | Status: DC

## 2015-07-30 MED ORDER — METOCLOPRAMIDE HCL 5 MG/ML IJ SOLN: 10.0000 mg | Freq: Once | INTRAMUSCULAR | Status: DC

## 2015-07-30 MED ORDER — ONDANSETRON HCL 4 MG/2ML IJ SOLN
4.0000 mg | Freq: Once | INTRAMUSCULAR | Status: AC
  Administered 2015-07-30: 4 mg via INTRAVENOUS
  Filled 2015-07-30: qty 2

## 2015-07-30 MED ORDER — HALOPERIDOL LACTATE 5 MG/ML IJ SOLN
2.0000 mg | Freq: Once | INTRAMUSCULAR | Status: AC
  Administered 2015-07-30: 2 mg via INTRAVENOUS
  Filled 2015-07-30: qty 1

## 2015-07-30 MED ORDER — SUCRALFATE 1 GM/10ML PO SUSP: 1.0000 g | Freq: Three times a day (TID) | ORAL | 0 refills | Status: DC

## 2015-07-30 NOTE — ED Triage Notes (Signed)
Patient here with sharp, severe generalized abdominal pain x 2 days. Vomiting and diarrhea with same. States he was diagnosed with ulcer in past and taking no meds for same. Pale and diaphoretic on arrival

## 2015-07-30 NOTE — ED Provider Notes (Signed)
[facility]-EMERGENCY DEPT Provider Note   CSN: [id number] Arrival date & time: 07/30/15  1024  First Provider Contact:  First MD Initiated Contact with Patient 07/30/15 1051     History   Chief Complaint Chief Complaint  Patient presents with  . Abdominal Pain  . Emesis   HPI Carl Barber is a 25 y.o. male.  HPI 25 y.o. male with a hx of HIV, Frequent ED visits, presents to the Emergency Department today complaining of epigastric abdominal pain with onset yesterday. Seen for the same. Similar to previous. States pain feels like a sharp/burning sensation. Previously told it was an ulcer, but PO remedies are not working (Pepcid, Protonix). Notes N/V without exacerbation from PO intake notes pain without aggravation. Seen by Infectious Disease on 06-14-15 and given GI referral, but patient unsure of appointment or any follow up. No fevers. No CP/SOB. No headaches. No other symptoms noted.   Past Medical History:  Diagnosis Date  . Abdominal pain   . HIV disease [facility])     Patient Active Problem List   Diagnosis Date Noted  . HIV disease (HCC) 01/25/2015  . Abdominal pain 01/25/2015    History reviewed. No pertinent surgical history.   Home Medications    Prior to Admission medications   Medication Sig Start Date End Date Taking? Authorizing Provider  abacavir-dolutegravir-lamiVUDine (TRIUMEQ) 600-50-300 MG tablet Take 1 tablet by mouth daily. 03/06/15  Yes Judyann Munson, MD  famotidine (PEPCID) 20 MG tablet Take 1 tablet (20 mg total) by mouth 2 (two) times daily. 05/15/15  Yes Randall Hiss, MD  omeprazole (PRILOSEC) 20 MG capsule Take 1 capsule (20 mg total) by mouth 2 (two) times daily before a meal. 05/15/15  Yes Randall Hiss, MD    Family History No family history on file.  Social History Social History  Substance Use Topics  . Smoking status: Current Every Day Smoker    Types: Cigarettes  . Smokeless tobacco: Never Used  . Alcohol use 0.0 oz/week      Allergies   Review of patient's allergies indicates no known allergies.   Review of Systems Review of Systems ROS reviewed and all are negative for acute change except as noted in the HPI.  Physical Exam Updated Vital Signs BP 122/74   Pulse 77   Temp 98.3 F (36.8 C) (Oral)   Resp 18   Ht 5\' 4"  (1.626 m)   Wt 50.8 kg   SpO2 100%   BMI 19.22 kg/m   Physical Exam  Constitutional: He is oriented to person, place, and time. Vital signs are normal. He appears well-developed and well-nourished.  HENT:  Head: Normocephalic.  Right Ear: Hearing normal.  Left Ear: Hearing normal.  Eyes: Conjunctivae and EOM are normal. Pupils are equal, round, and reactive to light.  Neck: Normal range of motion. Neck supple.  Cardiovascular: Normal rate, regular rhythm, normal heart sounds and intact distal pulses.   Pulmonary/Chest: Effort normal and breath sounds normal. No respiratory distress. He has no wheezes. He has no rales. He exhibits no tenderness.  Abdominal: Soft. There is tenderness in the epigastric area. There is guarding. There is no rigidity, no rebound, no tenderness at McBurney's point and negative Murphy's sign.  Pt tense on exam. TTP epigastrium with focal tenderness.     Neurological: He is alert and oriented to person, place, and time.  Skin: Skin is warm and dry.  Psychiatric: He has a normal mood and affect. His speech is  normal and behavior is normal. Thought content normal.   ED Treatments / Results  Labs (all labs ordered are listed, but only abnormal results are displayed) Labs Reviewed  COMPREHENSIVE METABOLIC PANEL - Abnormal; Notable for the following:       Result Value   Glucose, Bld 127 (*)    ALT 14 (*)    All other components within normal limits  CBG MONITORING, ED - Abnormal; Notable for the following:    Glucose-Capillary 121 (*)    All other components within normal limits  LIPASE, BLOOD  CBC  URINALYSIS, ROUTINE W REFLEX MICROSCOPIC (NOT  AT [facility])   EKG  EKG Interpretation None      Radiology No results found.  Procedures Procedures (including critical care time)  Medications Ordered in ED Medications  famotidine (PEPCID) IVPB 20 mg premix (0 mg Intravenous Stopped 07/30/15 1143)  ondansetron (ZOFRAN) injection 4 mg (4 mg Intravenous Given 07/30/15 1103)  sodium chloride 0.9 % bolus 1,000 mL (1,000 mLs Intravenous New Bag/Given 07/30/15 1127)  haloperidol lactate (HALDOL) injection 2 mg (2 mg Intravenous Given 07/30/15 1127)  metoCLOPramide (REGLAN) injection 10 mg (10 mg Intravenous Given 07/30/15 1144)  diphenhydrAMINE (BENADRYL) injection 25 mg (25 mg Intravenous Given 07/30/15 1148)   Initial Impression / Assessment and Plan / ED Course  I have reviewed the triage vital signs and the nursing notes.  Pertinent labs & imaging results that were available during my care of the patient were reviewed by me and considered in my medical decision making (see chart for details).  Clinical Course    Final Clinical Impressions(s) / ED Diagnoses  I have reviewed and evaluated the relevant laboratory values  I have reviewed the relevant previous healthcare records. I obtained HPI from historian. Patient discussed with supervising physician  ED Course:  Assessment: Patient is a 25yM presents with abdominal pain since yesterday. On exam, nontoxic, nonseptic appearing, in no apparent distress. Patient's pain and other symptoms adequately managed in emergency department.  Fluid bolus given.  Labs and vitals reviewed.  Patient does not meet the SIRS or Sepsis criteria.  On repeat exam patient does not have a surgical abdomen and there are no peritoneal signs.  No indication of appendicitis, bowel obstruction, bowel perforation, cholecystitis, diverticulitis. Patient discharged home with symptomatic treatment and given strict instructions for follow-up. Due to continued symptoms will refer to GI. Symptoms controlled in ED with haldol,  Reglan, benadryl. Reexamination of abdomen unremarkable. Pt sleeping well. Tolerates PO. Will give Rx Carafate. I have also discussed reasons to return immediately to the ER.  Patient expresses understanding and agrees with plan.  Disposition/Plan:  DC Home Additional Verbal discharge instructions given and discussed with patient.  Pt Instructed to f/u with GI in the next week for evaluation and treatment of symptoms. Return precautions given Pt acknowledges and agrees with plan  Supervising Physician Shaune Pollack, MD   Final diagnoses:  Epigastric pain    New Prescriptions New Prescriptions   No medications on file     Audry Pili, PA-C 07/30/15 1306    Shaune Pollack, MD 07/30/15 [phone]

## 2015-07-31 ENCOUNTER — Emergency Department (HOSPITAL_COMMUNITY)
Admission: EM | Admit: 2015-07-31 | Discharge: 2015-07-31 | Disposition: A | Discharge status: Home / Self Care | Payer: Self-pay

## 2015-07-31 ENCOUNTER — Emergency Department (HOSPITAL_COMMUNITY)
Admission: EM | Admit: 2015-07-31 | Discharge: 2015-08-01 | Disposition: A | Discharge status: Home / Self Care | Payer: Self-pay | Attending: Dermatology | Admitting: Dermatology

## 2015-07-31 ENCOUNTER — Emergency Department (HOSPITAL_COMMUNITY): Admission: EM | Admit: 2015-07-31 | Discharge: 2015-07-31 | Discharge status: Absent without leave | Payer: Self-pay

## 2015-07-31 DIAGNOSIS — F17210 Nicotine dependence, cigarettes, uncomplicated: Secondary | ICD-10-CM | POA: Insufficient documentation

## 2015-07-31 DIAGNOSIS — Z5321 Procedure and treatment not carried out due to patient leaving prior to being seen by health care provider: Secondary | ICD-10-CM | POA: Insufficient documentation

## 2015-07-31 DIAGNOSIS — R109 Unspecified abdominal pain: Secondary | ICD-10-CM | POA: Insufficient documentation

## 2015-07-31 LAB — COMPREHENSIVE METABOLIC PANEL
ALBUMIN: 5.6 g/dL — AB (ref 3.5–5.0)
ALT: 18 U/L (ref 17–63)
ANION GAP: 13 (ref 5–15)
AST: 28 U/L (ref 15–41)
Alkaline Phosphatase: 49 U/L (ref 38–126)
BUN: 15 mg/dL (ref 6–20)
CO2: 25 mmol/L (ref 22–32)
Calcium: 10.7 mg/dL — ABNORMAL HIGH (ref 8.9–10.3)
Chloride: 96 mmol/L — ABNORMAL LOW (ref 101–111)
Creatinine, Ser: 0.93 mg/dL (ref 0.61–1.24)
GFR calc Af Amer: >60 mL/min (ref >60)
GFR calc non Af Amer: >60 mL/min (ref >60)
GLUCOSE: 113 mg/dL — AB (ref 65–99)
POTASSIUM: 2.9 mmol/L — AB (ref 3.5–5.1)
SODIUM: 134 mmol/L — AB (ref 135–145)
TOTAL PROTEIN: 8.7 g/dL — AB (ref 6.5–8.1)
Total Bilirubin: 0.8 mg/dL (ref 0.3–1.2)

## 2015-07-31 LAB — CBC
HEMATOCRIT: 48.6 % (ref 39.0–52.0)
HEMOGLOBIN: 17.1 g/dL — AB (ref 13.0–17.0)
MCH: 29.6 pg (ref 26.0–34.0)
MCHC: 35.2 g/dL (ref 30.0–36.0)
MCV: 84.1 fL (ref 78.0–100.0)
Platelets: 264 10*3/uL (ref 150–400)
RBC: 5.78 MIL/uL (ref 4.22–5.81)
RDW: 14 % (ref 11.5–15.5)
WBC: 13.5 10*3/uL — ABNORMAL HIGH (ref 4.0–10.5)

## 2015-07-31 LAB — LIPASE, BLOOD: LIPASE: 18 U/L (ref 11–51)

## 2015-07-31 MED ORDER — OXYCODONE-ACETAMINOPHEN 5-325 MG PO TABS
1.0000 | ORAL_TABLET | ORAL | Status: DC | PRN
  Administered 2015-07-31: 1 via ORAL
  Filled 2015-07-31: qty 1

## 2015-07-31 MED ORDER — ONDANSETRON 4 MG PO TBDP
4.0000 mg | ORAL_TABLET | Freq: Once | ORAL | Status: AC | PRN
  Administered 2015-07-31: 4 mg via ORAL
  Filled 2015-07-31: qty 1

## 2015-07-31 NOTE — ED Notes (Signed)
Pt called for In waiting area. NO response.

## 2015-07-31 NOTE — ED Notes (Signed)
Called for triage, no response.

## 2015-07-31 NOTE — ED Notes (Signed)
Pt called for in waiting area for triage. NO Response

## 2015-07-31 NOTE — ED Triage Notes (Signed)
Pt states that he has a hx of stomach ulcers and states that they have been hurting him more and more. Tried meds at home w/o relief. Alert and oriented.

## 2015-08-01 NOTE — ED Notes (Signed)
Pt called from lobby, no answer. 

## 2015-08-26 ENCOUNTER — Other Ambulatory Visit: Payer: Self-pay | Admitting: Internal Medicine

## 2015-08-26 DIAGNOSIS — B20 Human immunodeficiency virus [HIV] disease: Secondary | ICD-10-CM

## 2015-09-03 ENCOUNTER — Emergency Department (HOSPITAL_COMMUNITY): Payer: Self-pay

## 2015-09-03 ENCOUNTER — Emergency Department (HOSPITAL_COMMUNITY)
Admission: EM | Admit: 2015-09-03 | Discharge: 2015-09-03 | Disposition: A | Discharge status: Home / Self Care | Payer: Self-pay | Attending: Emergency Medicine | Admitting: Emergency Medicine

## 2015-09-03 ENCOUNTER — Encounter (HOSPITAL_COMMUNITY): Payer: Self-pay | Admitting: Emergency Medicine

## 2015-09-03 DIAGNOSIS — F1290 Cannabis use, unspecified, uncomplicated: Secondary | ICD-10-CM | POA: Insufficient documentation

## 2015-09-03 DIAGNOSIS — F17210 Nicotine dependence, cigarettes, uncomplicated: Secondary | ICD-10-CM | POA: Insufficient documentation

## 2015-09-03 DIAGNOSIS — R1013 Epigastric pain: Secondary | ICD-10-CM | POA: Insufficient documentation

## 2015-09-03 DIAGNOSIS — Z79899 Other long term (current) drug therapy: Secondary | ICD-10-CM | POA: Insufficient documentation

## 2015-09-03 DIAGNOSIS — R112 Nausea with vomiting, unspecified: Secondary | ICD-10-CM

## 2015-09-03 DIAGNOSIS — K920 Hematemesis: Secondary | ICD-10-CM | POA: Insufficient documentation

## 2015-09-03 DIAGNOSIS — Z21 Asymptomatic human immunodeficiency virus [HIV] infection status: Secondary | ICD-10-CM | POA: Insufficient documentation

## 2015-09-03 LAB — RAPID URINE DRUG SCREEN, HOSP PERFORMED
Amphetamines: NOT DETECTED
Barbiturates: NOT DETECTED
Benzodiazepines: POSITIVE — AB
Cocaine: NOT DETECTED
Opiates: POSITIVE — AB
Tetrahydrocannabinol: POSITIVE — AB

## 2015-09-03 LAB — COMPREHENSIVE METABOLIC PANEL
ALT: 27 U/L (ref 17–63)
AST: 44 U/L — ABNORMAL HIGH (ref 15–41)
Albumin: 5.4 g/dL — ABNORMAL HIGH (ref 3.5–5.0)
Alkaline Phosphatase: 62 U/L (ref 38–126)
Anion gap: 20 — ABNORMAL HIGH (ref 5–15)
BUN: 22 mg/dL — ABNORMAL HIGH (ref 6–20)
CO2: 23 mmol/L (ref 22–32)
Calcium: 10.1 mg/dL (ref 8.9–10.3)
Chloride: 91 mmol/L — ABNORMAL LOW (ref 101–111)
Creatinine, Ser: 1.16 mg/dL (ref 0.61–1.24)
GFR calc Af Amer: >60 mL/min (ref >60)
GFR calc non Af Amer: >60 mL/min (ref >60)
Glucose, Bld: 122 mg/dL — ABNORMAL HIGH (ref 65–99)
Potassium: 2.8 mmol/L — ABNORMAL LOW (ref 3.5–5.1)
Sodium: 134 mmol/L — ABNORMAL LOW (ref 135–145)
Total Bilirubin: 0.9 mg/dL (ref 0.3–1.2)
Total Protein: 8.4 g/dL — ABNORMAL HIGH (ref 6.5–8.1)

## 2015-09-03 LAB — CBC
HCT: 50.2 % (ref 39.0–52.0)
Hemoglobin: 17.2 g/dL — ABNORMAL HIGH (ref 13.0–17.0)
MCH: 28.9 pg (ref 26.0–34.0)
MCHC: 34.3 g/dL (ref 30.0–36.0)
MCV: 84.2 fL (ref 78.0–100.0)
Platelets: 286 10*3/uL (ref 150–400)
RBC: 5.96 MIL/uL — ABNORMAL HIGH (ref 4.22–5.81)
RDW: 13.9 % (ref 11.5–15.5)
WBC: 15.4 10*3/uL — ABNORMAL HIGH (ref 4.0–10.5)

## 2015-09-03 LAB — URINALYSIS, ROUTINE W REFLEX MICROSCOPIC
Bilirubin Urine: NEGATIVE
Glucose, UA: NEGATIVE mg/dL
Hgb urine dipstick: NEGATIVE
Ketones, ur: 40 mg/dL — AB
Leukocytes, UA: NEGATIVE
Nitrite: NEGATIVE
Protein, ur: NEGATIVE mg/dL
Specific Gravity, Urine: 1.02 (ref 1.005–1.030)
pH: 8.5 — ABNORMAL HIGH (ref 5.0–8.0)

## 2015-09-03 LAB — LIPASE, BLOOD: Lipase: 22 U/L (ref 11–51)

## 2015-09-03 MED ORDER — PROMETHAZINE HCL 25 MG PO TABS: 25.0000 mg | ORAL_TABLET | Freq: Four times a day (QID) | ORAL | 0 refills | Status: DC | PRN

## 2015-09-03 MED ORDER — MORPHINE SULFATE (PF) 4 MG/ML IV SOLN
4.0000 mg | Freq: Once | INTRAVENOUS | Status: AC
  Administered 2015-09-03: 4 mg via INTRAVENOUS
  Filled 2015-09-03: qty 1

## 2015-09-03 MED ORDER — GI COCKTAIL ~~LOC~~
30.0000 mL | Freq: Once | ORAL | Status: AC
  Administered 2015-09-03: 30 mL via ORAL
  Filled 2015-09-03: qty 30

## 2015-09-03 MED ORDER — HALOPERIDOL LACTATE 5 MG/ML IJ SOLN
4.0000 mg | Freq: Once | INTRAMUSCULAR | Status: AC
  Administered 2015-09-03: 4 mg via INTRAVENOUS
  Filled 2015-09-03: qty 1

## 2015-09-03 MED ORDER — HYDROCODONE-ACETAMINOPHEN 5-325 MG PO TABS: 2.0000 | ORAL_TABLET | ORAL | 0 refills | Status: DC | PRN

## 2015-09-03 MED ORDER — FAMOTIDINE IN NACL 20-0.9 MG/50ML-% IV SOLN
20.0000 mg | Freq: Once | INTRAVENOUS | Status: AC
  Administered 2015-09-03: 20 mg via INTRAVENOUS
  Filled 2015-09-03: qty 50

## 2015-09-03 MED ORDER — POTASSIUM CHLORIDE 10 MEQ/100ML IV SOLN
10.0000 meq | Freq: Once | INTRAVENOUS | Status: AC
  Administered 2015-09-03: 10 meq via INTRAVENOUS
  Filled 2015-09-03: qty 100

## 2015-09-03 MED ORDER — SODIUM CHLORIDE 0.9 % IV BOLUS (SEPSIS)
1000.0000 mL | Freq: Once | INTRAVENOUS | Status: AC
  Administered 2015-09-03: 1000 mL via INTRAVENOUS

## 2015-09-03 MED ORDER — ONDANSETRON HCL 4 MG/2ML IJ SOLN: 4.0000 mg | Freq: Once | INTRAMUSCULAR | Status: DC

## 2015-09-03 NOTE — ED Notes (Signed)
RN called into room due to patient feeling short of breath. Patient's vital signs stable.

## 2015-09-03 NOTE — ED Notes (Signed)
Patient made aware urine sample is needed. Urinal placed at bedside. Patient encouraged to void when able. 

## 2015-09-03 NOTE — ED Notes (Signed)
Patient transported to X-ray 
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
## 2015-09-03 NOTE — ED Provider Notes (Signed)
[facility]-EMERGENCY DEPT Provider Note   CSN: [id number] Arrival date & time: 09/03/15  1720     History   Chief Complaint Chief Complaint  Patient presents with  . Abdominal Pain  . Spasms  . Shortness of Breath    HPI Carl Barber is a 25 y.o. male who presents with epigastric pain. PMH significant for frequent presentations of the same, HIV, marijuana use. He states that over the past 3-4 months he has had this pain. It is exactly the same as he has had in the past. It is intermittent and throughout the abdomen although worse in the epigastric area. He reports associated decreased PO intake, nausea, vomiting, hematemesis, weight loss. Hematemesis is blood streaked but no clots. Denies fever, chills, chest pain, SOB, constipation/diarrhea, melena/hematochezia, irritative voiding symptoms. He is asking for an endoscopy. Did not follow up with GI because they didn't take his insurance. Has been taking Pepcid, Prilosec, and Carafate with no relief. Still smokes marijuana and states that "I've been smoking for 17 years, don't tell me that it's that".  HPI  Past Medical History:  Diagnosis Date  . Abdominal pain   . HIV disease [facility](HCC)     Patient Active Problem List   Diagnosis Date Noted  . HIV disease (HCC) 01/25/2015  . Abdominal pain 01/25/2015    History reviewed. No pertinent surgical history.     Home Medications    Prior to Admission medications   Medication Sig Start Date End Date Taking? Authorizing Provider  famotidine (PEPCID) 20 MG tablet Take 1 tablet (20 mg total) by mouth 2 (two) times daily. 05/15/15   Randall Hissornelius N Van Dam, MD  omeprazole (PRILOSEC) 20 MG capsule Take 1 capsule (20 mg total) by mouth 2 (two) times daily before a meal. 05/15/15   Randall Hissornelius N Van Dam, MD  sucralfate (CARAFATE) 1 GM/10ML suspension Take 10 mLs (1 g total) by mouth 4 (four) times daily -  with meals and at bedtime. 07/30/15   Audry Piliyler Mohr, PA-C  TRIUMEQ 600-50-300 MG tablet TAKE 1  TABLET BY MOUTH EVERY DAY 08/28/15   Ginnie SmartJeffrey C Hatcher, MD    Family History No family history on file.  Social History Social History  Substance Use Topics  . Smoking status: Current Every Day Smoker    Types: Cigarettes  . Smokeless tobacco: Never Used  . Alcohol use 0.0 oz/week     Allergies   Review of patient's allergies indicates no known allergies.   Review of Systems Review of Systems  Constitutional: Negative for chills and fever.  Respiratory: Negative for shortness of breath.   Cardiovascular: Negative for chest pain.  Gastrointestinal: Positive for abdominal pain, nausea and vomiting. Negative for blood in stool, constipation and diarrhea.       Hematemesis  Genitourinary: Negative for dysuria, frequency and hematuria.  All other systems reviewed and are negative.    Physical Exam Updated Vital Signs BP 123/63 (BP Location: Right Arm)   Pulse 109   Temp 98.4 F (36.9 C) (Oral)   Resp 22   Ht 5\' 4"  (1.626 m)   Wt 49.9 kg   SpO2 100%   BMI 18.88 kg/m   Physical Exam  Constitutional: He is oriented to person, place, and time. He appears well-developed and well-nourished. No distress.  Thin  HENT:  Head: Normocephalic and atraumatic.  Eyes: Conjunctivae are normal. Pupils are equal, round, and reactive to light. Right eye exhibits no discharge. Left eye exhibits no discharge. No scleral icterus.  Neck: Normal range of motion. Neck supple.  Cardiovascular: Normal rate and regular rhythm.  Exam reveals no gallop and no friction rub.   No murmur heard. Pulmonary/Chest: Effort normal and breath sounds normal. No respiratory distress. He has no wheezes. He has no rales. He exhibits no tenderness.  Abdominal: Soft. He exhibits no distension and no mass. There is tenderness. There is guarding. There is no rebound. No hernia.  Generalized tenderness with maximal point of tenderness in the epigastric area  Musculoskeletal: He exhibits no edema.  Neurological:  He is alert and oriented to person, place, and time.  Skin: Skin is warm and dry.  Psychiatric: His speech is normal. His affect is angry. He is agitated.  Nursing note and vitals reviewed.    ED Treatments / Results  Labs (all labs ordered are listed, but only abnormal results are displayed) Labs Reviewed  COMPREHENSIVE METABOLIC PANEL - Abnormal; Notable for the following:       Result Value   Sodium 134 (*)    Potassium 2.8 (*)    Chloride 91 (*)    Glucose, Bld 122 (*)    BUN 22 (*)    Total Protein 8.4 (*)    Albumin 5.4 (*)    AST 44 (*)    Anion gap 20 (*)    All other components within normal limits  CBC - Abnormal; Notable for the following:    WBC 15.4 (*)    RBC 5.96 (*)    Hemoglobin 17.2 (*)    All other components within normal limits  LIPASE, BLOOD  URINALYSIS, ROUTINE W REFLEX MICROSCOPIC (NOT AT [facility])    EKG  EKG Interpretation None       Radiology Dg Chest 2 View  Result Date: 09/03/2015 CLINICAL DATA:  Shortness of breath, abdominal pain EXAM: CHEST  2 VIEW COMPARISON:  05/23/2015 FINDINGS: The heart size and mediastinal contours are within normal limits. Both lungs are clear. The visualized skeletal structures are unremarkable. IMPRESSION: No active cardiopulmonary disease. Electronically Signed   By: Elige Ko   On: 09/03/2015 18:24    Procedures Procedures (including critical care time)  Medications Ordered in ED Medications  sodium chloride 0.9 % bolus 1,000 mL (0 mLs Intravenous Stopped 09/03/15 2007)  famotidine (PEPCID) IVPB 20 mg premix (0 mg Intravenous Stopped 09/03/15 1937)  gi cocktail (Maalox,Lidocaine,Donnatal) (30 mLs Oral Given 09/03/15 1842)  morphine 4 MG/ML injection 4 mg (4 mg Intravenous Given 09/03/15 1842)  potassium chloride 10 mEq in 100 mL IVPB (0 mEq Intravenous Stopped 09/03/15 2008)  haloperidol lactate (HALDOL) injection 4 mg (4 mg Intravenous Given 09/03/15 1852)  sodium chloride 0.9 % bolus 1,000 mL (0 mLs  Intravenous Stopped 09/03/15 2052)     Initial Impression / Assessment and Plan / ED Course  I have reviewed the triage vital signs and the nursing notes.  Pertinent labs & imaging results that were available during my care of the patient were reviewed by me and considered in my medical decision making (see chart for details).  Clinical Course   25 year old male presents with epigastric pain. Possibly hyperemesis syndrome vs PUD. He has been on appropriate medicines without total relief of symptoms so suspect it is more likely hyperemesis even though patient is adamant it is not. He is frustrated and angry that he cannot get an endoscopy tonight. Discussed the reasoning for emergent endoscopy. Will treat similarly as before with fluids, haldol, zofran, gi cocktail, and pepcid.   CBC  remarkable for leukocytosis of 15.4. CMP remarkable for mild hyponatremia, hypokalemia of 2.8, hypochloremia, elevated BUN, elevated glucose, and increased anion gap of 20. K replaced and 2 L IVF total given. UA remarkable for 40 ketones. UDS remarkable for positive benzos, opioids, and THC.  On recheck, patient appears to be resting comfortably. He states he wants to go home. Will d/c and urged him to make an appointment with GI again. Patient is NAD, non-toxic, with stable VS. Patient is informed of clinical course, understands medical decision making process, and agrees with plan. Opportunity for questions provided and all questions answered. Return precautions given.   Final Clinical Impressions(s) / ED Diagnoses   Final diagnoses:  Epigastric pain  Non-intractable vomiting with nausea, vomiting of unspecified type    New Prescriptions Discharge Medication List as of 09/03/2015  8:34 PM    START taking these medications   Details  HYDROcodone-acetaminophen (NORCO/VICODIN) 5-325 MG tablet Take 2 tablets by mouth every 4 (four) hours as needed., Starting Sun 09/03/2015, Print    promethazine (PHENERGAN) 25  MG tablet Take 1 tablet (25 mg total) by mouth every 6 (six) hours as needed for nausea or vomiting., Starting Sun 09/03/2015, Print         Bethel Born, PA-C 09/04/15 1515    Raeford Razor, MD 09/14/15 [phone]

## 2015-09-03 NOTE — ED Triage Notes (Signed)
Per EMS, patient has abdominal pain, along with bilateral upper extremity spasms. Patient had been diagnosed with stomach ulcers. Patient received 100 mcg of fentanyl en route with EMS.

## 2015-09-03 NOTE — ED Notes (Signed)
Patient states, "My pain is so bad that if I go home I will shoot myself." PA made aware. No new orders received.

## 2015-09-03 NOTE — ED Notes (Signed)
No respiratory or acute distress noted alert and oriented x 3 call light in reach no reaction to medication noted visitor at bedside. No pain voiced.

## 2015-09-03 NOTE — ED Notes (Signed)
Pt aware that a urine sample is needed. Pt given urinal and instructed to press call bell if assistance was needed.

## 2015-09-03 NOTE — ED Notes (Signed)
Patient placed on 2 L [place] for comfort.

## 2015-09-03 NOTE — ED Notes (Signed)
Bed: [id number] Expected date:  Expected time:  Means of arrival:  Comments: EMS-ulcers/muscle cramps

## 2015-09-04 ENCOUNTER — Encounter: Payer: Self-pay | Admitting: Infectious Diseases

## 2015-09-04 ENCOUNTER — Ambulatory Visit: Payer: Self-pay | Admitting: *Deleted

## 2015-09-04 DIAGNOSIS — F4323 Adjustment disorder with mixed anxiety and depressed mood: Secondary | ICD-10-CM

## 2015-09-04 NOTE — BH Specialist Note (Signed)
Counselor met with Carl Barber in the exam room today for a warm hand off.  Patient was under a large amount of stress as evidenced by being tearful and communicating that he could not deal with the pain in his stomach any longer.  Counselor provided support and encouragement accordingly for patient. Patient was accompanied by a friend who was also tearful.  Patient shared that they were both just frustrated because he could not find a doctor or treatment to help him with his problem.  Counselor recommended once again that he meet in order to further process what is going on in his life. Patient said he would make an appointment when he checked out today.    Rolena Infante, MA, LPC [facility]

## 2015-09-20 ENCOUNTER — Ambulatory Visit: Payer: Self-pay | Admitting: Infectious Diseases

## 2015-10-05 ENCOUNTER — Encounter (HOSPITAL_COMMUNITY): Payer: Self-pay

## 2015-10-05 ENCOUNTER — Emergency Department (HOSPITAL_COMMUNITY)
Admission: EM | Admit: 2015-10-05 | Discharge: 2015-10-05 | Disposition: A | Discharge status: Home / Self Care | Payer: Self-pay | Attending: Emergency Medicine | Admitting: Emergency Medicine

## 2015-10-05 DIAGNOSIS — Z79899 Other long term (current) drug therapy: Secondary | ICD-10-CM | POA: Insufficient documentation

## 2015-10-05 DIAGNOSIS — R197 Diarrhea, unspecified: Secondary | ICD-10-CM | POA: Insufficient documentation

## 2015-10-05 DIAGNOSIS — F17210 Nicotine dependence, cigarettes, uncomplicated: Secondary | ICD-10-CM | POA: Insufficient documentation

## 2015-10-05 DIAGNOSIS — R112 Nausea with vomiting, unspecified: Secondary | ICD-10-CM | POA: Insufficient documentation

## 2015-10-05 LAB — COMPREHENSIVE METABOLIC PANEL
ALT: 15 U/L — AB (ref 17–63)
AST: 22 U/L (ref 15–41)
Albumin: 4.7 g/dL (ref 3.5–5.0)
Alkaline Phosphatase: 55 U/L (ref 38–126)
Anion gap: 16 — ABNORMAL HIGH (ref 5–15)
BILIRUBIN TOTAL: 0.6 mg/dL (ref 0.3–1.2)
BUN: 9 mg/dL (ref 6–20)
CO2: 16 mmol/L — ABNORMAL LOW (ref 22–32)
CREATININE: 0.78 mg/dL (ref 0.61–1.24)
Calcium: 10.1 mg/dL (ref 8.9–10.3)
Chloride: 105 mmol/L (ref 101–111)
Glucose, Bld: 174 mg/dL — ABNORMAL HIGH (ref 65–99)
Potassium: 3.2 mmol/L — ABNORMAL LOW (ref 3.5–5.1)
Sodium: 137 mmol/L (ref 135–145)
TOTAL PROTEIN: 7.5 g/dL (ref 6.5–8.1)

## 2015-10-05 LAB — URINALYSIS, ROUTINE W REFLEX MICROSCOPIC
BILIRUBIN URINE: NEGATIVE
GLUCOSE, UA: 100 mg/dL — AB
Hgb urine dipstick: NEGATIVE
KETONES UR: 40 mg/dL — AB
Leukocytes, UA: NEGATIVE
Nitrite: NEGATIVE
PH: 8.5 — AB (ref 5.0–8.0)
PROTEIN: NEGATIVE mg/dL
Specific Gravity, Urine: 1.021 (ref 1.005–1.030)

## 2015-10-05 LAB — CBC
HCT: 43.8 % (ref 39.0–52.0)
Hemoglobin: 14.6 g/dL (ref 13.0–17.0)
MCH: 28.9 pg (ref 26.0–34.0)
MCHC: 33.3 g/dL (ref 30.0–36.0)
MCV: 86.6 fL (ref 78.0–100.0)
PLATELETS: 290 10*3/uL (ref 150–400)
RBC: 5.06 MIL/uL (ref 4.22–5.81)
RDW: 13.1 % (ref 11.5–15.5)
WBC: 11.7 10*3/uL — AB (ref 4.0–10.5)

## 2015-10-05 LAB — RAPID URINE DRUG SCREEN, HOSP PERFORMED
Amphetamines: NOT DETECTED
BARBITURATES: NOT DETECTED
Benzodiazepines: NOT DETECTED
Cocaine: NOT DETECTED
Opiates: NOT DETECTED
Tetrahydrocannabinol: POSITIVE — AB

## 2015-10-05 LAB — LIPASE, BLOOD: Lipase: 20 U/L (ref 11–51)

## 2015-10-05 MED ORDER — SODIUM CHLORIDE 0.9 % IV BOLUS (SEPSIS)
1000.0000 mL | Freq: Once | INTRAVENOUS | Status: AC
Start: 2015-10-05 — End: 2015-10-05
  Administered 2015-10-05: 1000 mL via INTRAVENOUS

## 2015-10-05 MED ORDER — MORPHINE SULFATE (PF) 4 MG/ML IV SOLN
4.0000 mg | Freq: Once | INTRAVENOUS | Status: AC
  Administered 2015-10-05: 4 mg via INTRAVENOUS
  Filled 2015-10-05: qty 1

## 2015-10-05 MED ORDER — ONDANSETRON HCL 4 MG/2ML IJ SOLN
4.0000 mg | Freq: Once | INTRAMUSCULAR | Status: AC
  Administered 2015-10-05: 4 mg via INTRAVENOUS
  Filled 2015-10-05: qty 2

## 2015-10-05 MED ORDER — PROMETHAZINE HCL 25 MG RE SUPP: 25.0000 mg | Freq: Four times a day (QID) | RECTAL | 0 refills | Status: DC | PRN

## 2015-10-05 MED ORDER — HALOPERIDOL LACTATE 5 MG/ML IJ SOLN
2.0000 mg | Freq: Once | INTRAMUSCULAR | Status: AC
  Administered 2015-10-05: 2 mg via INTRAVENOUS
  Filled 2015-10-05: qty 1

## 2015-10-05 MED ORDER — ONDANSETRON HCL 4 MG/2ML IJ SOLN
INTRAMUSCULAR | Status: AC
  Filled 2015-10-05: qty 2

## 2015-10-05 NOTE — ED Notes (Signed)
Pt requesting more pain medicine. EDP aware

## 2015-10-05 NOTE — ED Notes (Signed)
Patient nauseous before discharge and wanted pain med prescription. MD aware. No new orders for pain prescription and told he can talk ibuprofen and to follow-up with GI.

## 2015-10-05 NOTE — ED Triage Notes (Signed)
Pt states that he has been having n/v/d since this afternoon. Pt also c/o og generalized abd pain, denies fevers.

## 2015-10-05 NOTE — ED Notes (Signed)
EDP aware of pt pain and is at bedside

## 2015-10-05 NOTE — ED Notes (Signed)
Requested urine sample from pt ? ?

## 2015-10-05 NOTE — ED Provider Notes (Signed)
[facility]-EMERGENCY DEPT Provider Note   CSN: [id number] Arrival date & time: 10/05/15  0208  By signing my name below, I, Sandrea Hammond, attest that this documentation has been prepared under the direction and in the presence of Gilda Crease, MD. Electronically Signed: Sandrea Hammond, ED Scribe. 10/05/15. 6:13 AM.   History   Chief Complaint Chief Complaint  Patient presents with  . Emesis    HPI Comments: Carl Barber is a 25 y.o. male who presents to the Emergency Department complaining of abdominal pain with nausea that started this afternoon. Pt says he has had previous episodes of these symptoms. Pt reports vomiting and diarrhea. Per chart review, pt was seen multiple times in the ED for epigastric pain and vomiting (02/2015, 3 visits in 05/2015, 07/2015, 08/2015). Per chart , pt was diagnosed with Cannabinoid hyperemesis syndrome in 02/2015.  The history is provided by the patient. No language interpreter was used.    Past Medical History:  Diagnosis Date  . Abdominal pain   . HIV disease [facility])     Patient Active Problem List   Diagnosis Date Noted  . HIV disease (HCC) 01/25/2015  . Abdominal pain 01/25/2015    History reviewed. No pertinent surgical history.     Home Medications    Prior to Admission medications   Medication Sig Start Date End Date Taking? Authorizing Provider  famotidine (PEPCID) 20 MG tablet Take 1 tablet (20 mg total) by mouth 2 (two) times daily. 05/15/15  Yes Randall Hiss, MD  HYDROcodone-acetaminophen (NORCO/VICODIN) 5-325 MG tablet Take 2 tablets by mouth every 4 (four) hours as needed. Patient taking differently: Take 2 tablets by mouth every 4 (four) hours as needed for moderate pain.  09/03/15  Yes Bethel Born, PA-C  omeprazole (PRILOSEC) 20 MG capsule Take 1 capsule (20 mg total) by mouth 2 (two) times daily before a meal. 05/15/15  Yes Randall Hiss, MD  promethazine (PHENERGAN) 25 MG tablet Take 1 tablet  (25 mg total) by mouth every 6 (six) hours as needed for nausea or vomiting. 09/03/15  Yes Bethel Born, PA-C  sucralfate (CARAFATE) 1 g tablet Take 1 g by mouth 4 (four) times daily -  with meals and at bedtime.   Yes Historical Provider, MD  TRIUMEQ 600-50-300 MG tablet TAKE 1 TABLET BY MOUTH EVERY DAY 08/28/15  Yes Ginnie Smart, MD    Family History No family history on file.  Social History Social History  Substance Use Topics  . Smoking status: Current Every Day Smoker    Types: Cigarettes  . Smokeless tobacco: Never Used  . Alcohol use 0.0 oz/week     Allergies   Review of patient's allergies indicates no known allergies.   Review of Systems Review of Systems  Gastrointestinal: Positive for abdominal pain, diarrhea, nausea and vomiting.  All other systems reviewed and are negative.    Physical Exam Updated Vital Signs BP 105/77   Pulse 91   Temp 98.5 F (36.9 C) (Oral)   Resp 16   Ht 5\' 4"  (1.626 m)   Wt 116 lb (52.6 kg)   SpO2 97%   BMI 19.91 kg/m   Physical Exam  Constitutional: He is oriented to person, place, and time. He appears well-developed and well-nourished. No distress.  HENT:  Head: Normocephalic and atraumatic.  Right Ear: Hearing normal.  Left Ear: Hearing normal.  Nose: Nose normal.  Mouth/Throat: Oropharynx is clear and moist and mucous membranes are normal.  Eyes: Conjunctivae and EOM are normal. Pupils are equal, round, and reactive to light.  Neck: Normal range of motion. Neck supple.  Cardiovascular: Regular rhythm, S1 normal and S2 normal.  Exam reveals no gallop and no friction rub.   No murmur heard. Pulmonary/Chest: Effort normal and breath sounds normal. No respiratory distress. He exhibits no tenderness.  Abdominal: Soft. Normal appearance and bowel sounds are normal. There is no hepatosplenomegaly. There is no rebound, no guarding, no tenderness at McBurney's point and negative Murphy's sign. No hernia.  Diffuse  abdominal tenderness  Musculoskeletal: Normal range of motion.  Neurological: He is alert and oriented to person, place, and time. He has normal strength. No cranial nerve deficit or sensory deficit. Coordination normal. GCS eye subscore is 4. GCS verbal subscore is 5. GCS motor subscore is 6.  Skin: Skin is warm, dry and intact. No rash noted. No cyanosis.  Psychiatric: He has a normal mood and affect. His speech is normal and behavior is normal. Thought content normal.  Nursing note and vitals reviewed.    ED Treatments / Results   DIAGNOSTIC STUDIES: Oxygen Saturation is 100% on RA, adequate by my interpretation.    COORDINATION OF CARE: 2:50 AM Discussed treatment plan with pt at bedside and pt agreed to plan.   Labs (all labs ordered are listed, but only abnormal results are displayed) Labs Reviewed  COMPREHENSIVE METABOLIC PANEL - Abnormal; Notable for the following:       Result Value   Potassium 3.2 (*)    CO2 16 (*)    Glucose, Bld 174 (*)    ALT 15 (*)    Anion gap 16 (*)    All other components within normal limits  CBC - Abnormal; Notable for the following:    WBC 11.7 (*)    All other components within normal limits  URINALYSIS, ROUTINE W REFLEX MICROSCOPIC (NOT AT [facility]) - Abnormal; Notable for the following:    pH 8.5 (*)    Glucose, UA 100 (*)    Ketones, ur 40 (*)    All other components within normal limits  URINE RAPID DRUG SCREEN, HOSP PERFORMED - Abnormal; Notable for the following:    Tetrahydrocannabinol POSITIVE (*)    All other components within normal limits  LIPASE, BLOOD    EKG  EKG Interpretation None       Radiology No results found.  Procedures Procedures (including critical care time)  Medications Ordered in ED Medications  ondansetron (ZOFRAN) 4 MG/2ML injection (not administered)  haloperidol lactate (HALDOL) injection 2 mg (2 mg Intravenous Given 10/05/15 0313)  morphine 4 MG/ML injection 4 mg (4 mg Intravenous Given 10/05/15  0427)  sodium chloride 0.9 % bolus 1,000 mL (0 mLs Intravenous Stopped 10/05/15 0611)  ondansetron (ZOFRAN) injection 4 mg (4 mg Intravenous Given 10/05/15 0427)     Initial Impression / Assessment and Plan / ED Course  I have reviewed the triage vital signs and the nursing notes.  Pertinent labs & imaging results that were available during my care of the patient were reviewed by me and considered in my medical decision making (see chart for details).  Clinical Course    Patient presents to the ER for evaluation of nausea and vomiting. Patient has been seen in the ER previously with intractable and cyclic vomiting syndrome. Abdominal exam revealed diffuse tenderness but no concerning signs of acute surgical process. Patient treated with IV fluids and symptomatic treatment.  Final Clinical Impressions(s) / ED Diagnoses  Final diagnoses:  Nausea vomiting and diarrhea    New Prescriptions New Prescriptions   No medications on file   I personally performed the services described in this documentation, which was scribed in my presence. The recorded information has been reviewed and is accurate.      Gilda Crease, MD 10/05/15 [phone]

## 2015-10-05 NOTE — ED Notes (Signed)
Pt resting quietly in bed in NAD, asleep.

## 2016-01-05 ENCOUNTER — Other Ambulatory Visit: Payer: Self-pay | Admitting: Infectious Disease

## 2016-01-05 ENCOUNTER — Other Ambulatory Visit: Payer: Self-pay | Admitting: Infectious Diseases

## 2016-01-05 DIAGNOSIS — R1013 Epigastric pain: Secondary | ICD-10-CM

## 2016-01-05 DIAGNOSIS — B20 Human immunodeficiency virus [HIV] disease: Secondary | ICD-10-CM

## 2016-02-01 ENCOUNTER — Encounter: Payer: Self-pay | Admitting: Infectious Diseases

## 2016-02-29 ENCOUNTER — Other Ambulatory Visit: Payer: Self-pay | Admitting: Infectious Diseases

## 2016-02-29 DIAGNOSIS — B20 Human immunodeficiency virus [HIV] disease: Secondary | ICD-10-CM

## 2016-03-10 ENCOUNTER — Other Ambulatory Visit: Payer: Self-pay | Admitting: Internal Medicine

## 2016-03-10 DIAGNOSIS — B20 Human immunodeficiency virus [HIV] disease: Secondary | ICD-10-CM

## 2016-04-11 ENCOUNTER — Ambulatory Visit (INDEPENDENT_AMBULATORY_CARE_PROVIDER_SITE_OTHER): Admitting: Infectious Diseases

## 2016-04-11 ENCOUNTER — Encounter: Payer: Self-pay | Admitting: Infectious Diseases

## 2016-04-11 VITALS — BP 133/90 | HR 102 | Temp 98.8°F

## 2016-04-11 DIAGNOSIS — B20 Human immunodeficiency virus [HIV] disease: Secondary | ICD-10-CM | POA: Diagnosis not present

## 2016-04-11 DIAGNOSIS — Z113 Encounter for screening for infections with a predominantly sexual mode of transmission: Secondary | ICD-10-CM

## 2016-04-11 DIAGNOSIS — R109 Unspecified abdominal pain: Secondary | ICD-10-CM

## 2016-04-11 DIAGNOSIS — Z23 Encounter for immunization: Secondary | ICD-10-CM

## 2016-04-11 DIAGNOSIS — F5102 Adjustment insomnia: Secondary | ICD-10-CM | POA: Diagnosis not present

## 2016-04-11 DIAGNOSIS — Z79899 Other long term (current) drug therapy: Secondary | ICD-10-CM

## 2016-04-11 DIAGNOSIS — R519 Headache, unspecified: Secondary | ICD-10-CM

## 2016-04-11 DIAGNOSIS — G4700 Insomnia, unspecified: Secondary | ICD-10-CM | POA: Insufficient documentation

## 2016-04-11 DIAGNOSIS — R51 Headache: Secondary | ICD-10-CM

## 2016-04-11 LAB — LIPID PANEL
Cholesterol: 180 mg/dL (ref <200)
HDL: 46 mg/dL (ref >40)
LDL CALC: 101 mg/dL — AB (ref <100)
TRIGLYCERIDES: 166 mg/dL — AB (ref <150)
Total CHOL/HDL Ratio: 3.9 Ratio (ref <5.0)
VLDL: 33 mg/dL — AB (ref <30)

## 2016-04-11 LAB — CBC
HCT: 44.6 % (ref 38.5–50.0)
Hemoglobin: 14.8 g/dL (ref 13.2–17.1)
MCH: 29 pg (ref 27.0–33.0)
MCHC: 33.2 g/dL (ref 32.0–36.0)
MCV: 87.3 fL (ref 80.0–100.0)
MPV: 9.9 fL (ref 7.5–12.5)
Platelets: 266 10*3/uL (ref 140–400)
RBC: 5.11 MIL/uL (ref 4.20–5.80)
RDW: 15.3 % — AB (ref 11.0–15.0)
WBC: 7.4 10*3/uL (ref 3.8–10.8)

## 2016-04-11 LAB — COMPREHENSIVE METABOLIC PANEL
ALT: 11 U/L (ref 9–46)
AST: 16 U/L (ref 10–40)
Albumin: 4.8 g/dL (ref 3.6–5.1)
Alkaline Phosphatase: 59 U/L (ref 40–115)
BILIRUBIN TOTAL: 0.4 mg/dL (ref 0.2–1.2)
BUN: 9 mg/dL (ref 7–25)
CALCIUM: 10 mg/dL (ref 8.6–10.3)
CHLORIDE: 102 mmol/L (ref 98–110)
CO2: 29 mmol/L (ref 20–31)
CREATININE: 1.01 mg/dL (ref 0.60–1.35)
GLUCOSE: 82 mg/dL (ref 65–99)
Potassium: 4.8 mmol/L (ref 3.5–5.3)
SODIUM: 141 mmol/L (ref 135–146)
Total Protein: 7.3 g/dL (ref 6.1–8.1)

## 2016-04-11 NOTE — Assessment & Plan Note (Signed)
Resolved.  Pt feels better since no longer smoking marijuana.

## 2016-04-11 NOTE — Progress Notes (Signed)
   Subjective:    Patient ID: Carl Barber, male    DOB: September 20, 1990, 26 y.o.   MRN: [id number]  HPI 26 yo M with hx of HIV+ since 2011. Also recurrent episodes of abd pain.  Has prev been on triumeq.   HIV 1 RNA Quant (copies/mL)  Date Value  06/14/2015 <20  01/25/2015 63,355 (H)   CD4 T Cell Abs (/uL)  Date Value  06/14/2015 430  01/25/2015 320 (L)   States his abd pain is better now.  Taking omeprazole, trumeq.  Feeling well but incarcerated.  Has been having difficulty sleeping.  Having headaches. Doesn't feel they are related to sleep. Have them in posterior area of head.    Review of Systems  Constitutional: Negative for appetite change and unexpected weight change.  Gastrointestinal: Negative for abdominal pain, constipation and diarrhea.  Genitourinary: Negative for difficulty urinating.  Neurological: Positive for headaches.  Psychiatric/Behavioral: Positive for sleep disturbance.       Objective:   Physical Exam  Constitutional: He appears well-developed and well-nourished.  HENT:  Mouth/Throat: No oropharyngeal exudate.  Eyes: EOM are normal. Pupils are equal, round, and reactive to light.  Neck: Neck supple.  Cardiovascular: Normal rate, regular rhythm and normal heart sounds.   Pulmonary/Chest: Effort normal and breath sounds normal.  Abdominal: Soft. Bowel sounds are normal. There is no rebound.  Musculoskeletal: He exhibits no edema.  Lymphadenopathy:    He has no cervical adenopathy.  Psychiatric: His mood appears not anxious. His affect is not angry.  tearful       Assessment & Plan:

## 2016-04-11 NOTE — Assessment & Plan Note (Signed)
May take benadryl  qhs prn Or may use whatever is on jail formulary- ambien, restoril- as they deem fit.

## 2016-04-11 NOTE — Addendum Note (Signed)
Addended by: Lurlean Leyden on: 04/11/2016 03:39 PM   Modules accepted: Orders

## 2016-04-11 NOTE — Assessment & Plan Note (Signed)
May take tylenol  q8h prn or ibuprofen  q8h prn

## 2016-04-11 NOTE — Assessment & Plan Note (Signed)
He appears to be doing well He is unsure when he will be liberated.  PNVx, Mening and flu vax today Check his labs today.  rtc in 6 months.

## 2016-04-12 LAB — URINE CYTOLOGY ANCILLARY ONLY
Chlamydia: NEGATIVE
Neisseria Gonorrhea: NEGATIVE

## 2016-04-12 LAB — RPR

## 2016-04-12 LAB — T-HELPER CELL (CD4) - (RCID CLINIC ONLY)
CD4 T CELL HELPER: 17 % — AB (ref 33–55)
CD4 T Cell Abs: 520 /uL (ref 400–2700)

## 2016-04-15 LAB — HIV-1 RNA QUANT-NO REFLEX-BLD
HIV 1 RNA Quant: <20 copies/mL — AB
HIV-1 RNA Quant, Log: <1.3 Log copies/mL — AB

## 2016-05-24 ENCOUNTER — Other Ambulatory Visit: Payer: Self-pay | Admitting: Infectious Disease

## 2016-05-24 DIAGNOSIS — R1013 Epigastric pain: Secondary | ICD-10-CM

## 2016-06-13 ENCOUNTER — Other Ambulatory Visit: Payer: Self-pay | Admitting: Infectious Disease

## 2016-06-13 DIAGNOSIS — R1013 Epigastric pain: Secondary | ICD-10-CM

## 2016-06-17 ENCOUNTER — Other Ambulatory Visit: Payer: Self-pay | Admitting: Infectious Disease

## 2016-06-17 DIAGNOSIS — R1013 Epigastric pain: Secondary | ICD-10-CM

## 2016-07-05 IMAGING — CT CT ABD-PELV W/ CM
2 of 4 series · 16 of 46 positions shown, 18 images · IV contrast (Omni 300)
Comparison: 10/07/2012

CLINICAL DATA: Intermittent severe epigastric pain. Chronic
diarrhea.

EXAM:
CT ABDOMEN AND PELVIS WITH CONTRAST
TECHNIQUE: Multidetector CT imaging of the abdomen and pelvis was performed
using the standard protocol following bolus administration of
intravenous contrast.
CONTRAST:  100mL OMNIPAQUE IOHEXOL 300 MG/ML  SOLN

[Series 2: a/p w/ 5mm · axial · 0.61mm/px · z∈[+798,+1224]mm · 13 of 93 slices shown, 15 images]
[im 4/93  soft-tissue]
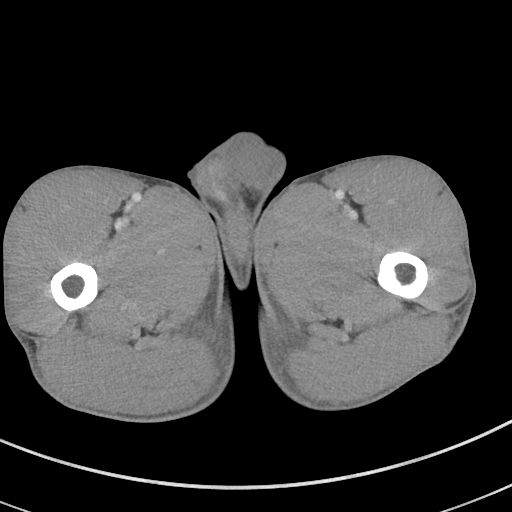
[im 4/93  bone]
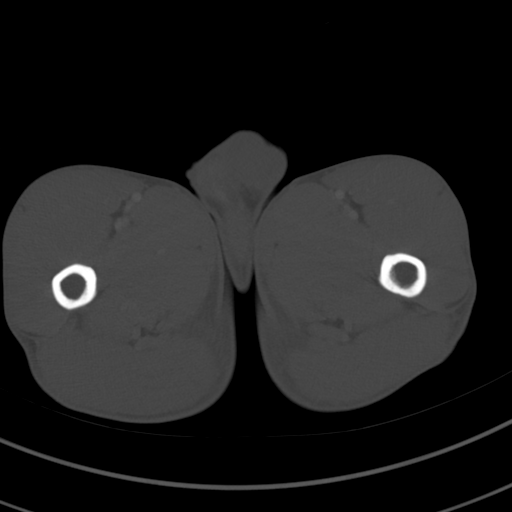
[im 12/93  soft-tissue]
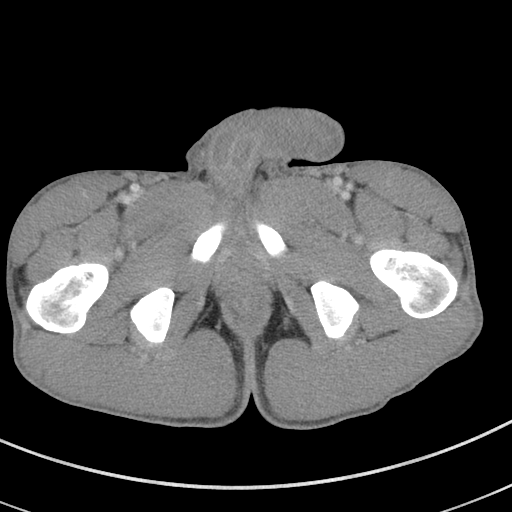
[im 20/93  soft-tissue]
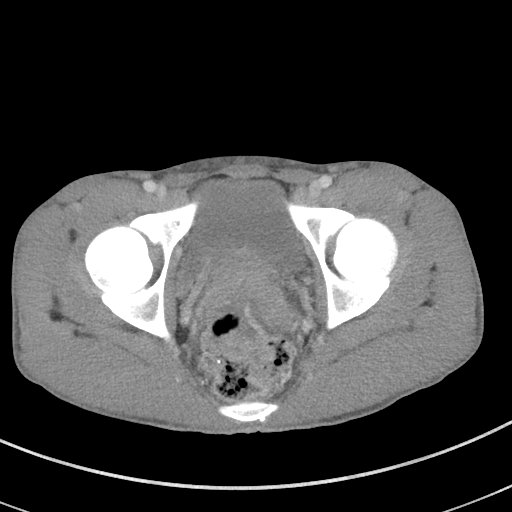
[im 27/93  soft-tissue]
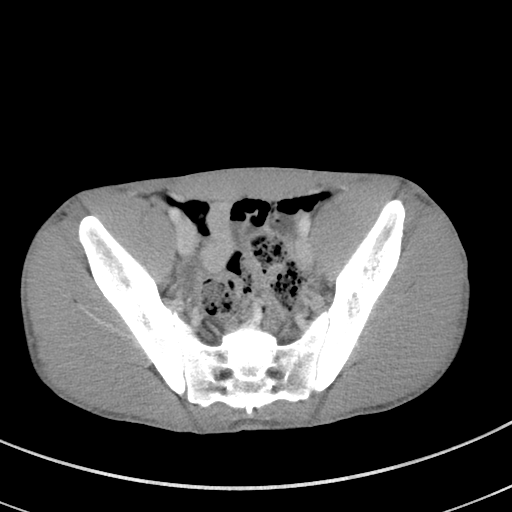
[im 31/93  soft-tissue]
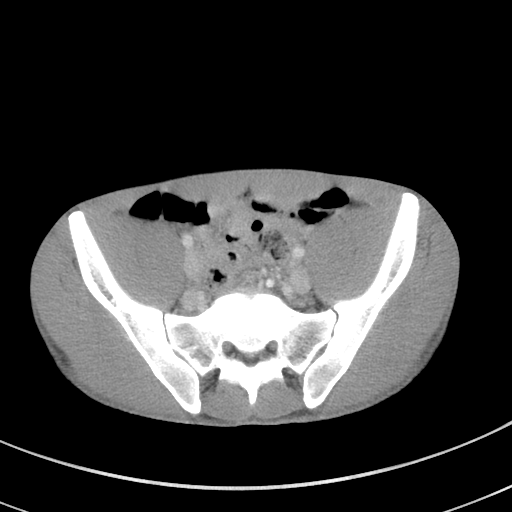
[im 39/93  soft-tissue]
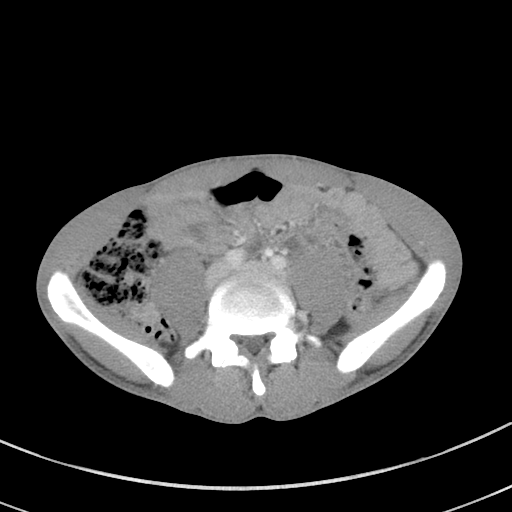
[im 47/93  soft-tissue]
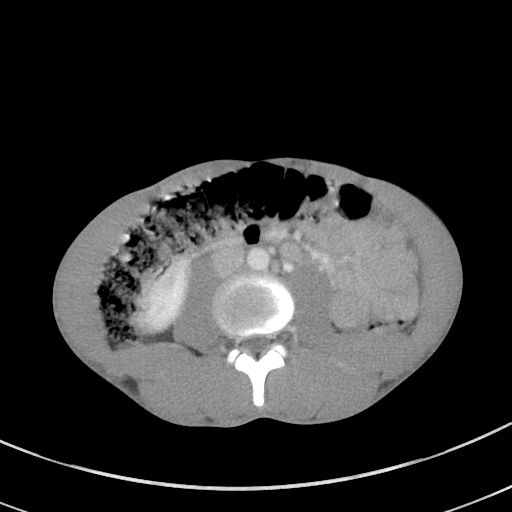
[im 54/93  soft-tissue]
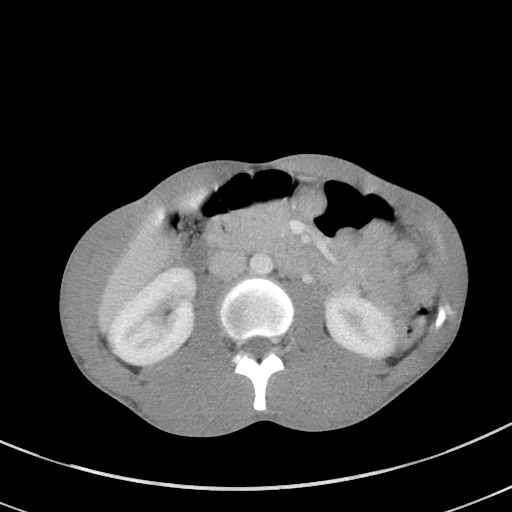
[im 62/93  soft-tissue]
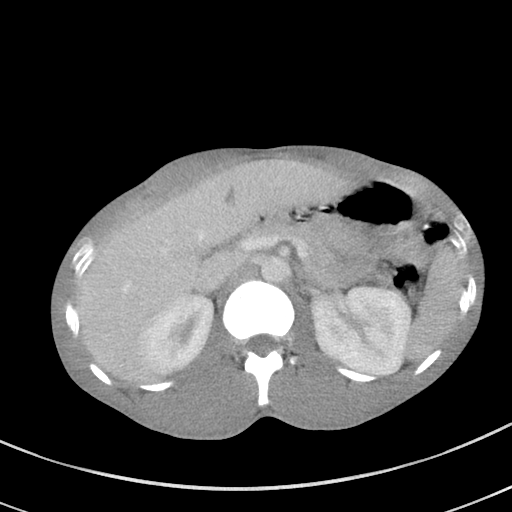
[im 62/93  bone]
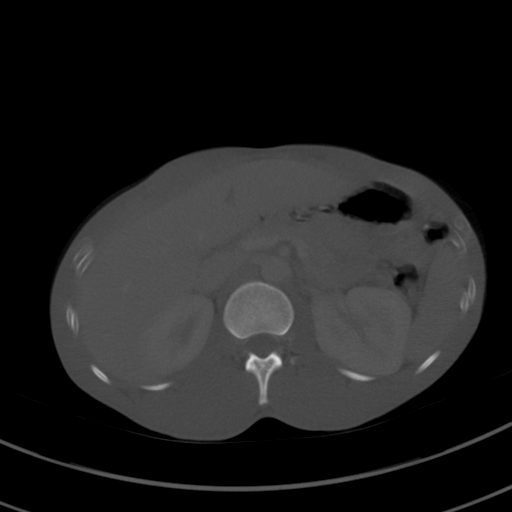
[im 66/93  soft-tissue]
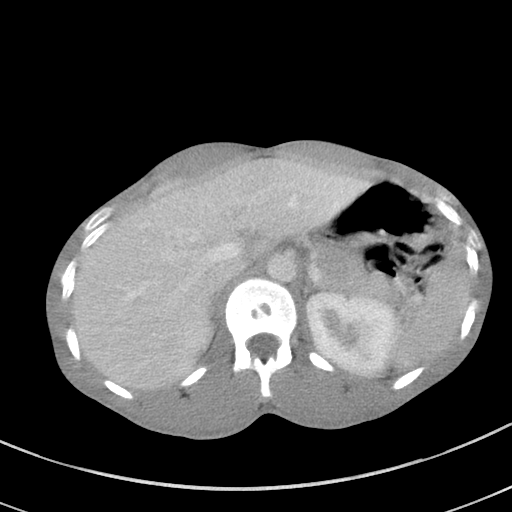
[im 73/93  soft-tissue]
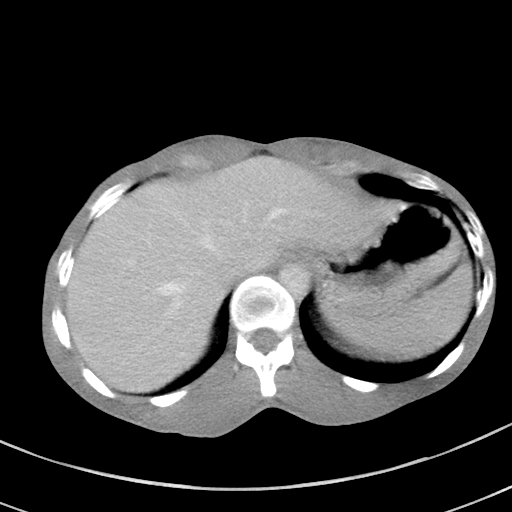
[im 81/93  soft-tissue]
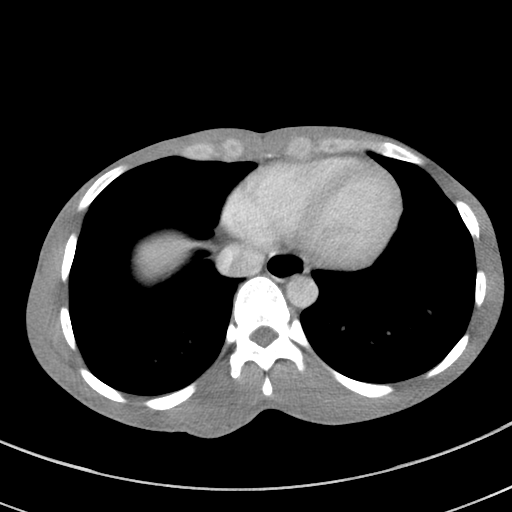
[im 89/93  soft-tissue]
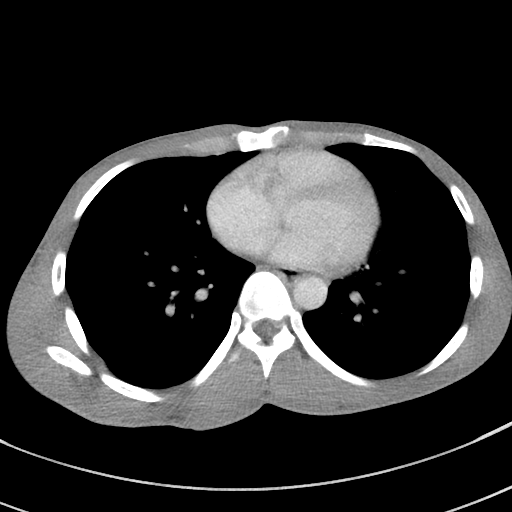

[Series 5: a/p w/ cor · coronal · 0.77mm/px · 3 of 142 slices shown]
[im 48/142  soft-tissue]
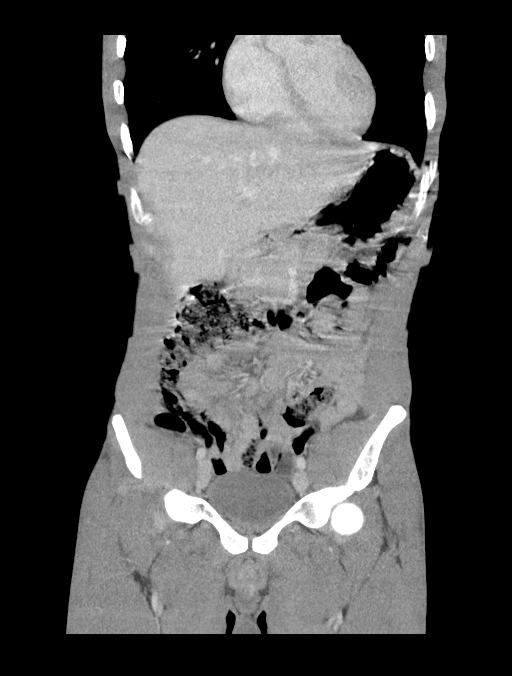
[im 63/142  soft-tissue]
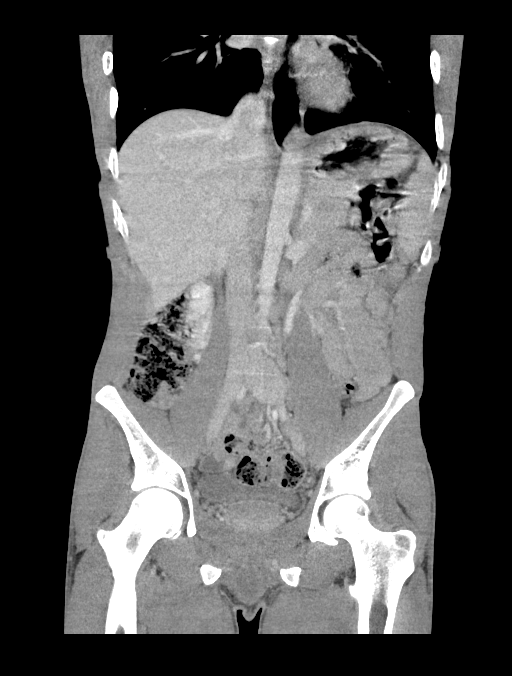
[im 79/142  soft-tissue]
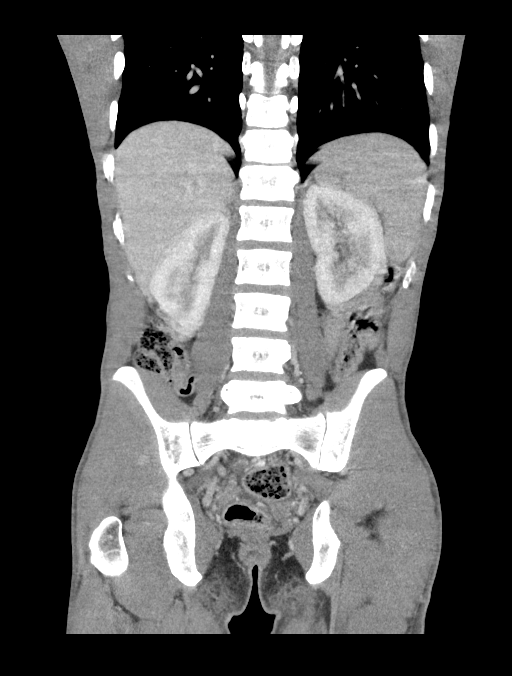

[16 of 46 positions shown; findings below may reference images not displayed]

FINDINGS: Lower chest:  Clear lung bases.  Normal heart size.

Hepatobiliary: Normal liver and gallbladder.

Pancreas: Normal.

Spleen: Normal.

Adrenals/Urinary Tract: Normal adrenal glands. Normal kidneys.
Normal bladder. No urolithiasis or obstructive uropathy.

Stomach/Bowel: No bowel wall thickening or dilatation. No
pneumatosis, pneumoperitoneum or portal venous gas. No abdominal or
pelvic free fluid.

Vascular/Lymphatic: Normal caliber abdominal aorta. No
lymphadenopathy.

Other: No fluid collection or hematoma.

Musculoskeletal: No acute osseous abnormality.
IMPRESSION: No acute abdominal or pelvic pathology.

## 2016-07-11 ENCOUNTER — Other Ambulatory Visit: Payer: Self-pay | Admitting: Infectious Disease

## 2016-07-11 DIAGNOSIS — R1013 Epigastric pain: Secondary | ICD-10-CM

## 2016-08-08 ENCOUNTER — Encounter: Payer: Self-pay | Admitting: Infectious Diseases

## 2016-10-11 ENCOUNTER — Other Ambulatory Visit: Payer: Self-pay | Admitting: Infectious Diseases

## 2016-10-11 DIAGNOSIS — B20 Human immunodeficiency virus [HIV] disease: Secondary | ICD-10-CM

## 2016-10-28 ENCOUNTER — Ambulatory Visit: Admitting: Infectious Diseases

## 2016-11-19 ENCOUNTER — Ambulatory Visit (INDEPENDENT_AMBULATORY_CARE_PROVIDER_SITE_OTHER): Admitting: Licensed Clinical Social Worker

## 2016-11-19 ENCOUNTER — Ambulatory Visit (INDEPENDENT_AMBULATORY_CARE_PROVIDER_SITE_OTHER): Admitting: Infectious Diseases

## 2016-11-19 ENCOUNTER — Encounter: Payer: Self-pay | Admitting: Infectious Diseases

## 2016-11-19 DIAGNOSIS — F321 Major depressive disorder, single episode, moderate: Secondary | ICD-10-CM

## 2016-11-19 DIAGNOSIS — B20 Human immunodeficiency virus [HIV] disease: Secondary | ICD-10-CM

## 2016-11-19 DIAGNOSIS — F32A Depression, unspecified: Secondary | ICD-10-CM | POA: Insufficient documentation

## 2016-11-19 DIAGNOSIS — Z23 Encounter for immunization: Secondary | ICD-10-CM | POA: Diagnosis not present

## 2016-11-19 DIAGNOSIS — R109 Unspecified abdominal pain: Secondary | ICD-10-CM

## 2016-11-19 DIAGNOSIS — F5102 Adjustment insomnia: Secondary | ICD-10-CM

## 2016-11-19 DIAGNOSIS — F329 Major depressive disorder, single episode, unspecified: Secondary | ICD-10-CM | POA: Insufficient documentation

## 2016-11-19 MED ORDER — DIPHENHYDRAMINE HCL 25 MG PO CAPS: 25.0000 mg | ORAL_CAPSULE | Freq: Every evening | ORAL | 1 refills | Status: AC | PRN

## 2016-11-19 MED ORDER — VENLAFAXINE HCL 37.5 MG PO TABS: 37.5000 mg | ORAL_TABLET | Freq: Two times a day (BID) | ORAL | 2 refills | Status: AC

## 2016-11-19 NOTE — Assessment & Plan Note (Signed)
Appears improved

## 2016-11-19 NOTE — Assessment & Plan Note (Signed)
Benadryl prn 

## 2016-11-19 NOTE — Progress Notes (Signed)
   Subjective:    Patient ID: Carl Barber, male    DOB: 01-Mar-1990, 26 y.o.   MRN: [id number]  HPI 26 yo M with hx of HIV+ since 2011. Also recurrent episodes of abd pain.  Has prev been on triumeq.  Remains incarcerated.  Has gained wt up to 148#. BMI now 25.  Has been feeling "hyper" when he takes ART. Feels like he has palpitations, tachypnea for a few minutes.  Abd pain is better.  Would like counselor for depression, has been tearful. When he asked in jail, he was put on suicide watch.  Can't sleep, at prev visit asked for benadryl but jail will not give him.  Release date in Jan 2019.   HIV 1 RNA Quant (copies/mL)  Date Value  04/11/2016 <20 DETECTED (A)  06/14/2015 <20  01/25/2015 63,355 (H)   CD4 T Cell Abs (/uL)  Date Value  04/11/2016 520  06/14/2015 430  01/25/2015 320 (L)    Review of Systems  Constitutional: Negative for appetite change and unexpected weight change.  Respiratory: Positive for shortness of breath.   Cardiovascular: Positive for palpitations.  Gastrointestinal: Negative for abdominal pain, constipation and diarrhea.  Genitourinary: Negative for difficulty urinating.  Psychiatric/Behavioral: Positive for dysphoric mood and sleep disturbance.  Please see HPI. All other systems reviewed and negative.     Objective:   Physical Exam  Constitutional: He appears well-developed and well-nourished.  HENT:  Mouth/Throat: No oropharyngeal exudate.  Eyes: EOM are normal. Pupils are equal, round, and reactive to light.  Neck: Neck supple.  Cardiovascular: Normal rate, regular rhythm and normal heart sounds.  Pulmonary/Chest: Effort normal and breath sounds normal.  Abdominal: Soft. Bowel sounds are normal. There is no tenderness. There is no rebound.  Musculoskeletal: He exhibits no edema.  Lymphadenopathy:    He has no cervical adenopathy.  Psychiatric: He has a normal mood and affect.       Assessment & Plan:

## 2016-11-19 NOTE — Addendum Note (Signed)
Addended by: Jazalyn Mondor C on: 11/19/2016 03:39 PM   Modules accepted: Orders

## 2016-11-19 NOTE — Assessment & Plan Note (Addendum)
Will check his labs Flu shot Discussed changing his meds, he wants to stay on his current despite possible adr.  He has HIV- 324 month old son.  Will see him back in 3-6 months

## 2016-11-19 NOTE — Assessment & Plan Note (Addendum)
Will get him set up with Sherrie.  She recommends antidepressant (will start effexor). If not effective, he needs [facility] eval.  He states he was dx with Bipolar when he was a teenager.  benardyl 25mg  qhs for sleep.  He is not suicidal and does not to be on suicide precautions.

## 2016-11-19 NOTE — BH Specialist Note (Signed)
Integrated Behavioral Health Initial Visit  MRN: [id number] Name: Carl Barber  Number of Integrated Behavioral Health Clinician visits:: 1/6 Session Start time: 2:53 pm  Session End time: 3:20 pm Total time: 27 mins  Type of Service: Integrated Behavioral Health- Individual/Family Interpretor:No. Interpretor Name and Language: N/A   Warm Hand Off Completed.       SUBJECTIVE: Carl Barber is a 26 y.o. male accompanied by [employer] Patient was referred by Dr. Ninetta LightsHatcher for current depressive symptoms.  Patient reports the following symptoms/concerns: "Depressed" and "aggrevated mood"; social isolation and requesting to remain inside his cell; hopelessness; fatigue; anhedonia; poor concentration; and difficulty initiating and maintaining sleep (not currently receiving sleep medication).  Patient also presented as fearful that he would be placed on Suicide Watch due to his statements, although he denied current suicidal ideations, plan, and intent.  Patient was vocal in wanting assistance for his depressive symptoms but denied wanting to follow protocol in the facility by completing a Sick Call Request to receive assistance.  [facility] attempted to provide intervention by reducing patient's fears and encouraged patient to complete the required paperwork to receive behavioral health services.  [facility] also educated patient that he could have his attorney petition the court for him to receive mental health services, based on the results of today's medical appointment.  Patient stated that he was receptive to taking antidepressant and sleep medications.  Patient reported that he was able to access the phone system to speak with his family to receive social support and reported good appetite and access to commissary.  Patient denied that he was fearful for his life, although he reported the environment as stressful and it is stressful waiting for his court case while serving  dead time.  Severity of problem: moderate  OBJECTIVE: Mood: Anxious and Depressed and Affect: Appropriate Risk of harm to self or others: No plan to harm self or others  ASSESSMENT: Patient is currently experiencing depression and anxiety and may benefit from behavioral health services and medication management in the facility.  GOALS ADDRESSED: Patient will: 1. Reduce symptoms of: anxiety and depression 2. Increase knowledge and/or ability of: coping skills and healthy habits  3. Demonstrate ability to: Increase healthy adjustment to current life circumstances and Increase motivation to adhere to plan of care  INTERVENTIONS: Interventions utilized: Supportive Counseling and Psychoeducation and/or Health Education   PLAN: 1. Patient is currently detained and does not have access to receive behavioral health services in the community.  Patient should follow the facility protocol to access behavioral health and psy by completing a Sick Call Request form.   Vergia AlbertsSherry Christeena Krogh, [facility]

## 2016-11-21 LAB — HIV-1 RNA QUANT-NO REFLEX-BLD
HIV 1 RNA QUANT: NOT DETECTED {copies}/mL
HIV-1 RNA Quant, Log: <1.3 Log copies/mL

## 2016-11-21 LAB — T-HELPER CELL (CD4) - (RCID CLINIC ONLY)
CD4 T CELL ABS: 680 /uL (ref 400–2700)
CD4 T CELL HELPER: 18 % — AB (ref 33–55)

## 2016-12-24 ENCOUNTER — Other Ambulatory Visit: Payer: Self-pay | Admitting: Infectious Diseases

## 2016-12-24 DIAGNOSIS — R1013 Epigastric pain: Secondary | ICD-10-CM

## 2017-01-15 ENCOUNTER — Ambulatory Visit: Admitting: Infectious Diseases

## 2017-01-15 ENCOUNTER — Encounter: Payer: Self-pay | Admitting: Infectious Diseases

## 2017-01-15 ENCOUNTER — Ambulatory Visit (INDEPENDENT_AMBULATORY_CARE_PROVIDER_SITE_OTHER): Admitting: Infectious Diseases

## 2017-01-15 VITALS — BP 154/84 | HR 96 | Temp 98.9°F

## 2017-01-15 DIAGNOSIS — Z79899 Other long term (current) drug therapy: Secondary | ICD-10-CM | POA: Diagnosis not present

## 2017-01-15 DIAGNOSIS — B20 Human immunodeficiency virus [HIV] disease: Secondary | ICD-10-CM

## 2017-01-15 DIAGNOSIS — F319 Bipolar disorder, unspecified: Secondary | ICD-10-CM | POA: Insufficient documentation

## 2017-01-15 DIAGNOSIS — R109 Unspecified abdominal pain: Secondary | ICD-10-CM

## 2017-01-15 DIAGNOSIS — Z113 Encounter for screening for infections with a predominantly sexual mode of transmission: Secondary | ICD-10-CM

## 2017-01-15 DIAGNOSIS — F3177 Bipolar disorder, in partial remission, most recent episode mixed: Secondary | ICD-10-CM

## 2017-01-15 NOTE — Assessment & Plan Note (Signed)
[facility]ail faxing his meds.  Greatly appreciate their help.

## 2017-01-15 NOTE — Progress Notes (Signed)
   Subjective:    Patient ID: Carl Barber, male    DOB: [birth year], 27 y.o.   MRN: [id number]  HPI  26yo M with hx of HIV+ since 2011. Also recurrent episodes of abd pain. Has prev been on triumeq. Remains incarcerated.  He was started on effexor at his prev visit.  Feels well, "getting fat".  Mental health in jail saw him and dx his a bipolar- started on lamictal, and ?.  Sleeping better, mood is better.   C/o R flank pain and dysuria.  He also has had "red eyes" from his rx.   HIV 1 RNA Quant (copies/mL)  Date Value  11/19/2016 <20 NOT DETECTED  04/11/2016 <20 DETECTED (A)  06/14/2015 <20   CD4 T Cell Abs (/uL)  Date Value  11/19/2016 680  04/11/2016 520  06/14/2015 430    Review of Systems  Constitutional: Negative for appetite change and unexpected weight change.  Gastrointestinal: Negative for constipation and diarrhea.  Genitourinary: Negative for difficulty urinating.  Psychiatric/Behavioral: Negative for dysphoric mood and sleep disturbance.  Please see HPI. All other systems reviewed and negative.     Objective:   Physical Exam  Constitutional: He appears well-developed and well-nourished.  HENT:  Mouth/Throat: No oropharyngeal exudate.  Eyes: EOM are normal. Pupils are equal, round, and reactive to light.  Neck: Neck supple.  Cardiovascular: Normal rate, regular rhythm and normal heart sounds.  Pulmonary/Chest: Effort normal and breath sounds normal.  Abdominal: Soft. Bowel sounds are normal. There is no tenderness. There is no rebound, no guarding and no CVA tenderness.  Musculoskeletal: He exhibits no edema.  Lymphadenopathy:    He has no cervical adenopathy.      Assessment & Plan:

## 2017-01-15 NOTE — Assessment & Plan Note (Signed)
Will check UA, STI screens.

## 2017-01-15 NOTE — Assessment & Plan Note (Signed)
Doing well Has gotten flu shot. meining Will see if he needs further HPV Will check his labs today.  rtc in 4 months

## 2017-01-16 LAB — LIPID PANEL
CHOL/HDL RATIO: 3.4 (calc) (ref <5.0)
Cholesterol: 189 mg/dL (ref <200)
HDL: 56 mg/dL (ref >40)
LDL CHOLESTEROL (CALC): 103 mg/dL — AB
Non-HDL Cholesterol (Calc): 133 mg/dL (calc) — ABNORMAL HIGH (ref <130)
Triglycerides: 188 mg/dL — ABNORMAL HIGH (ref <150)

## 2017-01-16 LAB — CBC
HCT: 45.3 % (ref 38.5–50.0)
HEMOGLOBIN: 15 g/dL (ref 13.2–17.1)
MCH: 28.1 pg (ref 27.0–33.0)
MCHC: 33.1 g/dL (ref 32.0–36.0)
MCV: 84.8 fL (ref 80.0–100.0)
MPV: 10.4 fL (ref 7.5–12.5)
Platelets: 270 10*3/uL (ref 140–400)
RBC: 5.34 10*6/uL (ref 4.20–5.80)
RDW: 14.2 % (ref 11.0–15.0)
WBC: 8.2 10*3/uL (ref 3.8–10.8)

## 2017-01-16 LAB — URINALYSIS, ROUTINE W REFLEX MICROSCOPIC
Bilirubin Urine: NEGATIVE
GLUCOSE, UA: NEGATIVE
HGB URINE DIPSTICK: NEGATIVE
Ketones, ur: NEGATIVE
LEUKOCYTES UA: NEGATIVE
Nitrite: NEGATIVE
PROTEIN: NEGATIVE
Specific Gravity, Urine: 1.018 (ref 1.001–1.03)
pH: >=8.5 — AB (ref 5.0–8.0)

## 2017-01-16 LAB — T-HELPER CELL (CD4) - (RCID CLINIC ONLY)
CD4 T CELL ABS: 620 /uL (ref 400–2700)
CD4 T CELL HELPER: 19 % — AB (ref 33–55)

## 2017-01-16 LAB — COMPREHENSIVE METABOLIC PANEL
AG Ratio: 2 (calc) (ref 1.0–2.5)
ALBUMIN MSPROF: 4.8 g/dL (ref 3.6–5.1)
ALT: 18 U/L (ref 9–46)
AST: 19 U/L (ref 10–40)
Alkaline phosphatase (APISO): 71 U/L (ref 40–115)
BILIRUBIN TOTAL: 0.3 mg/dL (ref 0.2–1.2)
BUN: 10 mg/dL (ref 7–25)
CALCIUM: 10 mg/dL (ref 8.6–10.3)
CO2: 29 mmol/L (ref 20–32)
Chloride: 104 mmol/L (ref 98–110)
Creat: 0.94 mg/dL (ref 0.60–1.35)
Globulin: 2.4 g/dL (calc) (ref 1.9–3.7)
Glucose, Bld: 113 mg/dL — ABNORMAL HIGH (ref 65–99)
POTASSIUM: 4.9 mmol/L (ref 3.5–5.3)
SODIUM: 142 mmol/L (ref 135–146)
TOTAL PROTEIN: 7.2 g/dL (ref 6.1–8.1)

## 2017-01-16 LAB — RPR: RPR: NONREACTIVE

## 2017-01-17 LAB — HIV-1 RNA QUANT-NO REFLEX-BLD
HIV 1 RNA QUANT: NOT DETECTED {copies}/mL
HIV-1 RNA QUANT, LOG: NOT DETECTED {Log_copies}/mL

## 2017-01-20 ENCOUNTER — Other Ambulatory Visit: Payer: Self-pay | Admitting: Infectious Diseases

## 2017-01-20 DIAGNOSIS — F319 Bipolar disorder, unspecified: Secondary | ICD-10-CM

## 2017-01-20 MED ORDER — DIVALPROEX SODIUM 500 MG PO DR TAB: 500.0000 mg | DELAYED_RELEASE_TABLET | Freq: Two times a day (BID) | ORAL | Status: AC

## 2017-01-20 MED ORDER — MIRTAZAPINE 15 MG PO TABS: 7.5000 mg | ORAL_TABLET | Freq: Every day | ORAL | 3 refills | Status: AC

## 2017-01-20 MED ORDER — LAMOTRIGINE 100 MG PO TABS: 100.0000 mg | ORAL_TABLET | Freq: Every day | ORAL | 2 refills | Status: AC

## 2017-01-20 MED ORDER — HYDROXYZINE PAMOATE 50 MG PO CAPS: 100.0000 mg | ORAL_CAPSULE | Freq: Three times a day (TID) | ORAL | 0 refills | Status: AC | PRN

## 2017-01-20 NOTE — Progress Notes (Signed)
[facility]

## 2017-02-20 IMAGING — CR DG CHEST 2V
2 series · 2 of 2 positions shown · non-contrast
Comparison: 05/23/2015

CLINICAL DATA: Shortness of breath, abdominal pain

EXAM:
CHEST  2 VIEW

[w chest lat]
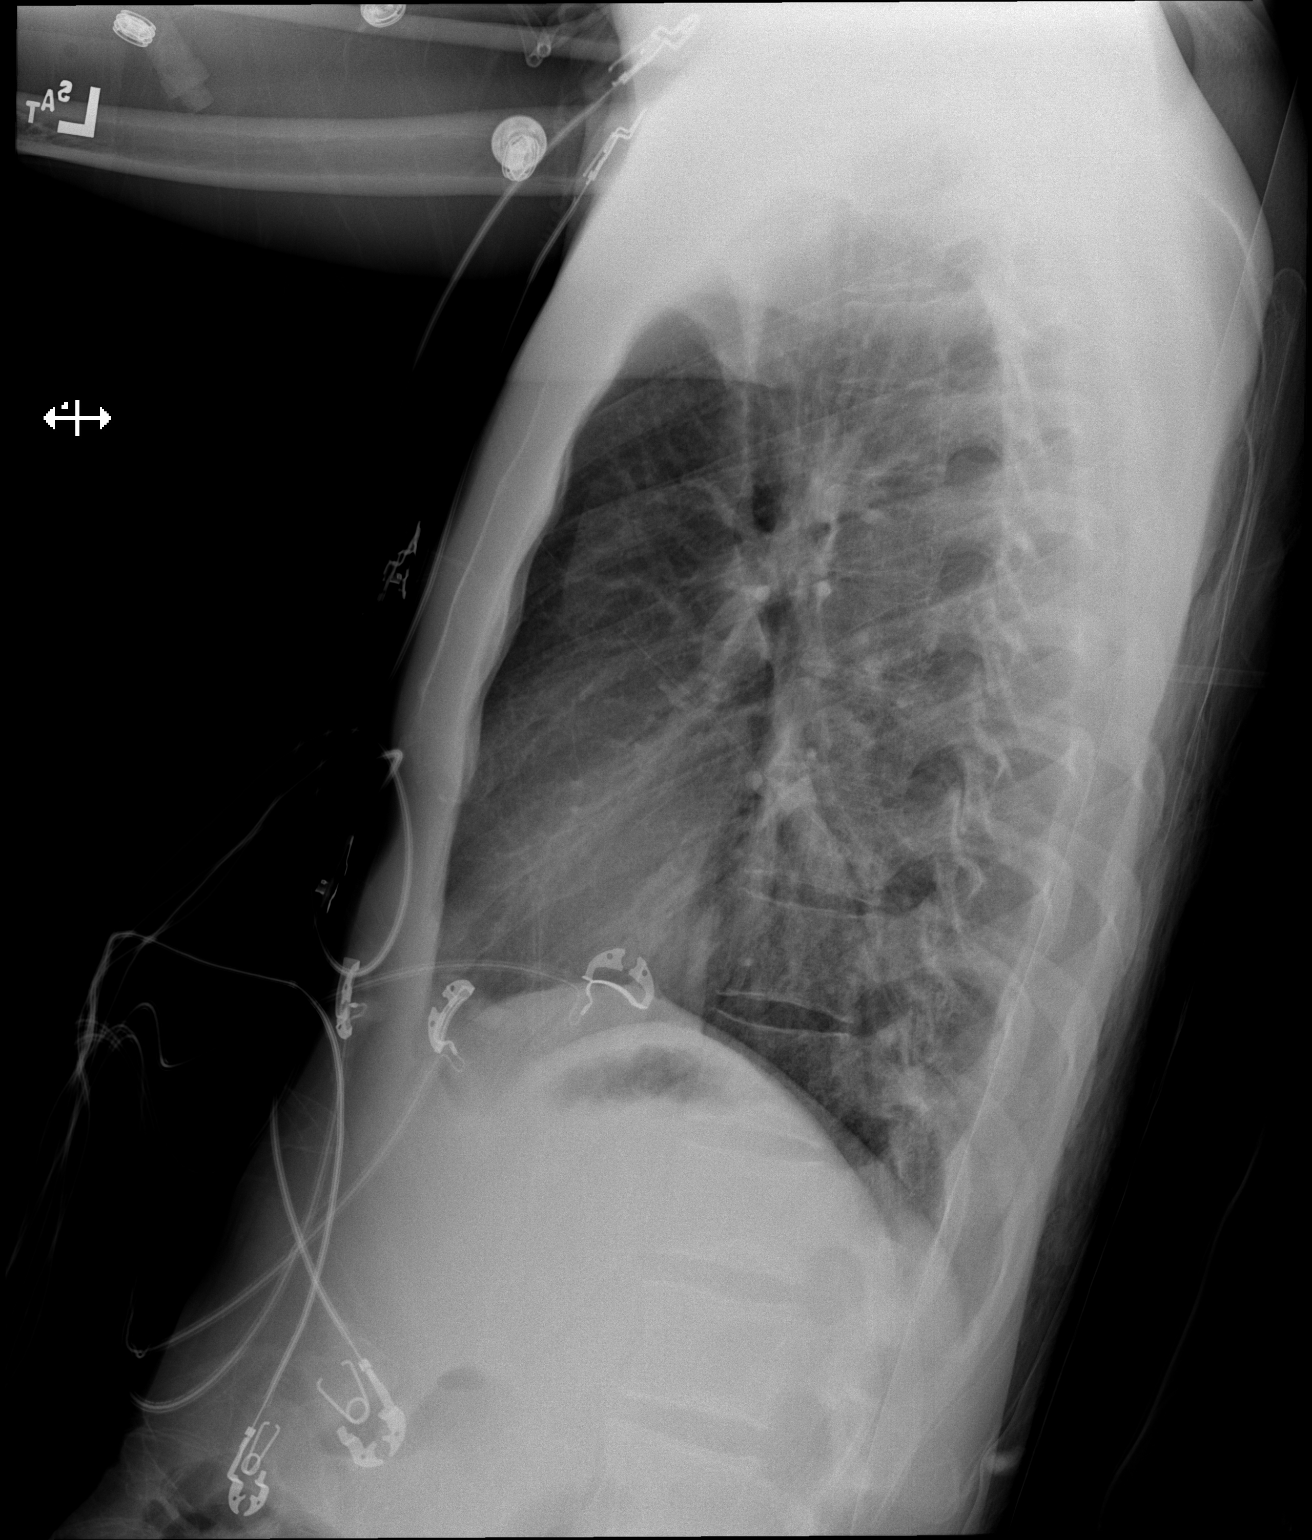

[x chest ap]
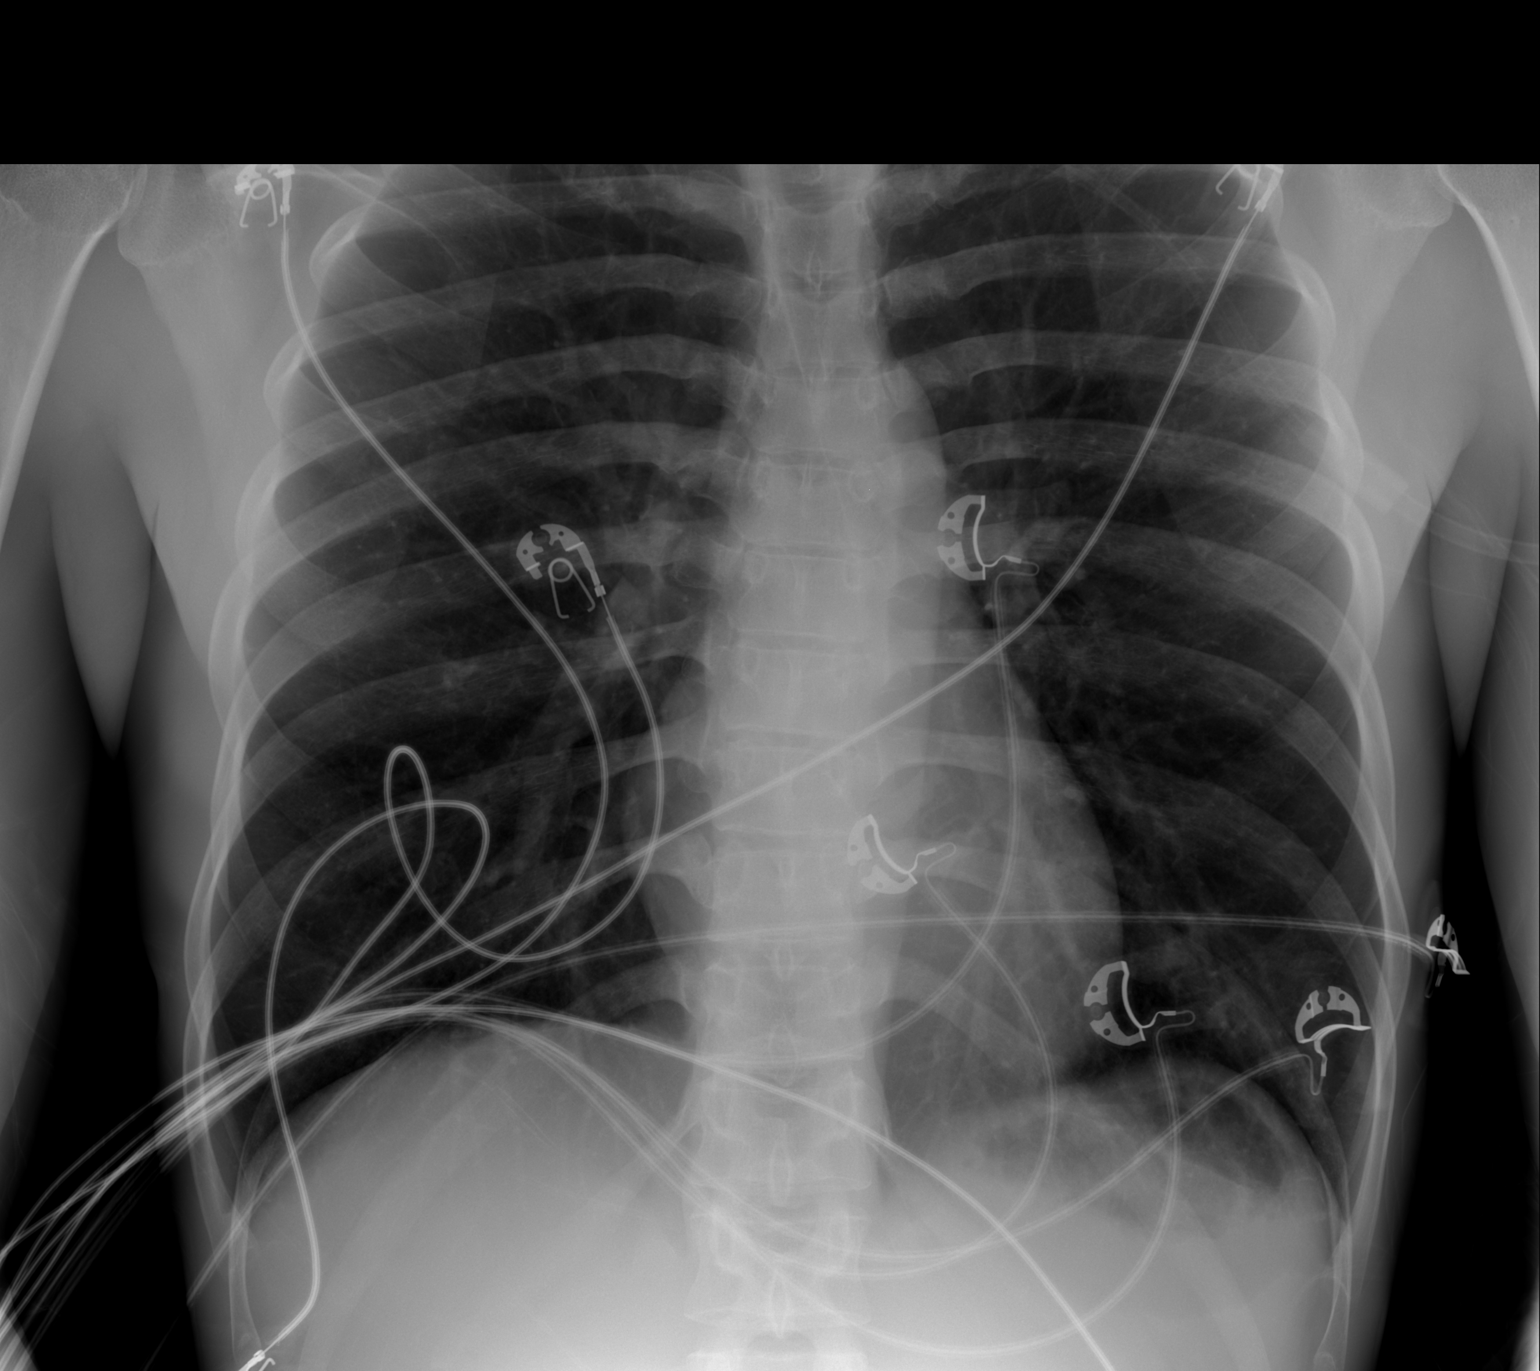

[2 of 2 positions shown; findings below may reference images not displayed]

FINDINGS: The heart size and mediastinal contours are within normal limits.
Both lungs are clear. The visualized skeletal structures are
unremarkable.
IMPRESSION: No active cardiopulmonary disease.

## 2017-05-26 ENCOUNTER — Ambulatory Visit: Admitting: Infectious Diseases

## 2017-11-07 ENCOUNTER — Telehealth: Payer: Self-pay

## 2017-11-07 NOTE — Telephone Encounter (Signed)
Attempted to reach out to patient regarding his future appointments at our office. Dr. Ninetta Lights has been promoted, and is reassigning his patients to other providers in our office. Patient will need to schedule future appointments with a new provider to continue care with [place].  Unable to reach patient at this time; left voicemail asking patient to call office back. Carl Barber, [place]
